# Patient Record
Sex: Female | Born: 2000 | Hispanic: No | Marital: Single | Smoking: Never smoker
Health system: Southern US, Community
[De-identification: ages and names within clinical notes are randomized; demographics above are authoritative.]

## PROBLEM LIST (undated history)

## (undated) DIAGNOSIS — R7303 Prediabetes: Secondary | ICD-10-CM

## (undated) DIAGNOSIS — T7840XA Allergy, unspecified, initial encounter: Secondary | ICD-10-CM

## (undated) DIAGNOSIS — Z9109 Other allergy status, other than to drugs and biological substances: Secondary | ICD-10-CM

## (undated) HISTORY — PX: NO PAST SURGERIES: SHX2092

## (undated) HISTORY — DX: Allergy, unspecified, initial encounter: T78.40XA

---
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
## 2021-01-29 NOTE — Progress Notes (Signed)
Adolescent Well Care Visit Chrissy Ealey is a 20 y.o. female who is here for well care.     PCP:  Tiarah Shisler, [place], MD   History was provided by the patient.  Confidentiality was discussed with the patient and, if applicable, with caregiver.  Patient's personal phone number: [phone]  Current Issues:  No patient concerns.  Will be graduating and starting college in the fall.  She has full rides to both [employer] and [employer].  She is interested in attending nursing school and is still deciding which school to attend.  Will be living at home next year.   Chronic Conditions:  Allergic rhinitis - well controlled   Tinea versicolor  - seen on April 2022 and treated with ketoconazole shampoo x 1 dose and ketoconazole cream daily for three weeks.  She treated for just under two weeks and then stopped after she saw significant improvement.  She recently started the rash to return.   Inflammatory acne - well controlled.  Using Cetaphil BID face wash + oil free moisturizer + Duac as previously advised.   Nutrition: Nutrition/Eating Behaviors: variety of fruits, veg, protein; eats 3 meals per day and occasional snack  Adequate calcium in diet?: yes  Supplements/ Vitamins: No   Exercise/ Media: Play any Sports?:  none Exercise:  goes to gym every day for 2 hours -- weight lifting, core strengthening, and cardio  Screen Time:  > 2 hours-counseling provided  Sleep:  Sleep: at least 7-8 hours, falls asleep easily Sleep apnea symptoms: no   Social Screening: Lives with: parents and two younger siblings  Parental relations:  good Activities, Work, and Regulatory affairs officer?:  Yes - working at [employer], 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  no  Education: School grade: 12th School performance: doing well; no concerns School behavior: doing well; no concerns  Menstruation:   Patient's last menstrual period was 01/26/2021 (within  days). Menstrual History: significant cramping for first 2 days; sometimes has to stay home from school.  Some improvement with heat pads + Advil.  No heavy bleeding.  Periods last about 5 days.  Occur about once per month, regular.    Dental Assessment: Patient has a dental home: yes  Confidential social history: Tobacco?  no Secondhand smoke exposure?  no Drugs/ETOH?  no  Sexually Active?  no   Pregnancy Prevention: abstinence  Safe at home, in school & in relationships? Yes Safe to self?  Yes  Screenings:  The patient completed the Rapid Assessment for Adolescent Preventive Services screening questionnaire and the following topics were identified as risk factors and discussed: healthy eating  In addition, the following topics were discussed as part of anticipatory guidance: pregnancy prevention, depression/anxiety.  PHQ-9 completed and results indicated no concerns for depression.   Physical Exam:  Vitals:   01/30/21 1338  BP: 102/60  Pulse: (!) 102  SpO2: 96%  Weight: 166 lb (75.3 kg)  Height: 5\' 6"  (1.676 m)   BP 102/60 (BP Location: Right Arm, Patient Position: Sitting, Cuff Size: Normal)   Pulse (!) 102   Ht 5\' 6"  (1.676 m)   Wt 166 lb (75.3 kg)   LMP 01/26/2021 (Within Days)   SpO2 96%   BMI 26.79 kg/m  Body mass index: body mass index is 26.79 kg/m. Blood pressure percentiles are not available for patients who are 18 years or older.   Hearing Screening   Method: Audiometry   125Hz  250Hz  500Hz  1000Hz  2000Hz  3000Hz   4000Hz  6000Hz  8000Hz   Right ear:   20 20 20  20     Left ear:   20 25 20  20       Visual Acuity Screening   Right eye Left eye Both eyes  Without correction:     With correction: 20/20 20/20 20/20   Comments: Patient wearing contacts   General: well developed, no acute distress, gait normal, interactive  HEENT: PERRL, normal oropharynx, TMs normal bilaterally Neck: supple, no lymphadenopathy CV: RRR no murmur noted PULM: normal aeration  throughout all lung fields, no crackles or wheezes Abdomen: soft, non-tender; no masses or HSM Extremities: warm and well perfused GU: Normal female external genitalia and GU SMR stage 4 Skin: mild hyperpigmented patches over chest; scattered comedones over forehead Neuro: alert and oriented, moves all extremities equally   Assessment and Plan:  Makenzie Weisner is a 20 y.o. female who is here for well care.   Encounter for routine adult physical exam with abnormal findings  Dysmenorrhea Moderate dysmenorrhea impacting school attendance.  No heavy bleeding.  Differential includes endometriosis.  - Recommend taking naproxen two days before and at least 2 days after period - take with food. Rx sent to pharmacy.  - Reviewed period app trackers to help predict cycles - Return precautions reviewed.  Consider combined OCP if no improvement.   Need for vaccination  Counseling provided for all of the following: -     Meningococcal conjugate vaccine 4-valent IM - Discussed Mening B.  Deferred by patient today since planning to live at home for college.   Tinea versicolor Very mild.  Recent improvement with topical antifungal but incompletely treated.  - Restart ketoconazole cream.  Continue for 3 wks.  Has cream remaining at home.  Kortne to call if she needs refill.   Comedonal acne Well controlled with current skin care regimen.   BMI 26.0-26.9,adult, overweight BMI with sudden upward velocity.  Praised current exercise regimen and reviewed healthy eating.  BP appropriate.   - Consider fasting lipid panel, Hgb A1c in future   Well teen: -Growth: BMI is not appropriate for age -Development: appropriate for age  -Social-Emotional: mood appropriate with good coping strategies -Discussed anticipatory guidance including pregnancy/STI prevention, alcohol/drug use, screen time limits -Hearing screening result:normal -Vision screening result: normal -STI screening completed and is normal -Blood  pressure: appropriate for age and height   Provided chart of adult providers that she can call to schedule future physical with if she is ready to transition.  Reviewed transition timeline.      Return for f/u PRN for dysmenorrhea; f/u in 1 yr with PCP for [facility] (or estab with adult provider). , MD [facility]

## 2021-01-30 ENCOUNTER — Other Ambulatory Visit (HOSPITAL_COMMUNITY)
Admission: RE | Admit: 2021-01-30 | Discharge: 2021-01-30 | Disposition: A | Discharge status: Home / Self Care | Payer: Medicaid Other | Source: Ambulatory Visit | Attending: Pediatrics | Admitting: Pediatrics

## 2021-01-30 ENCOUNTER — Encounter: Payer: Self-pay | Admitting: Pediatrics

## 2021-01-30 ENCOUNTER — Ambulatory Visit (INDEPENDENT_AMBULATORY_CARE_PROVIDER_SITE_OTHER): Payer: Medicaid Other | Admitting: Pediatrics

## 2021-01-30 VITALS — BP 102/60 | HR 102 | Ht 66.0 in | Wt 166.0 lb

## 2021-01-30 DIAGNOSIS — Z114 Encounter for screening for human immunodeficiency virus [HIV]: Secondary | ICD-10-CM | POA: Diagnosis not present

## 2021-01-30 DIAGNOSIS — N946 Dysmenorrhea, unspecified: Secondary | ICD-10-CM | POA: Diagnosis not present

## 2021-01-30 DIAGNOSIS — Z23 Encounter for immunization: Secondary | ICD-10-CM | POA: Diagnosis not present

## 2021-01-30 DIAGNOSIS — Z0001 Encounter for general adult medical examination with abnormal findings: Secondary | ICD-10-CM | POA: Diagnosis not present

## 2021-01-30 DIAGNOSIS — Z6826 Body mass index (BMI) 26.0-26.9, adult: Secondary | ICD-10-CM | POA: Diagnosis not present

## 2021-01-30 DIAGNOSIS — B360 Pityriasis versicolor: Secondary | ICD-10-CM | POA: Diagnosis not present

## 2021-01-30 DIAGNOSIS — L700 Acne vulgaris: Secondary | ICD-10-CM | POA: Insufficient documentation

## 2021-01-30 DIAGNOSIS — Z113 Encounter for screening for infections with a predominantly sexual mode of transmission: Secondary | ICD-10-CM | POA: Insufficient documentation

## 2021-01-30 DIAGNOSIS — Z68.41 Body mass index (BMI) pediatric, 5th percentile to less than 85th percentile for age: Secondary | ICD-10-CM | POA: Insufficient documentation

## 2021-01-30 LAB — POCT RAPID HIV: Rapid HIV, POC: NEGATIVE

## 2021-01-30 MED ORDER — NAPROXEN 375 MG PO TBEC: 375.0000 mg | DELAYED_RELEASE_TABLET | Freq: Two times a day (BID) | ORAL | 2 refills | Status: DC

## 2021-01-30 NOTE — Patient Instructions (Signed)
  As your medical provider, it is important to me that you continue to receive high-quality primary care services as you transition to adulthood.  After the age of 21, you can no longer be seen at the [facility] for your primary care health services.   Below is a list of adult medicine practices that are currently accepting new patients.  Please reach out to one of these practices to schedule a new patient appointment as soon as possible.  Please be aware that you will not be able to be seen at our office after your 22nd birthday.   Adult Primary Care Clinics Name Criteria Services   [facility]  Address: [address] [place], [place] [address]  Phone: [phone] Hours: Monday - Friday 9 AM -6 PM  Types of insurance accepted:  Commercial insurance [facility] (orange card) Medicaid Medicare Uninsured  Language services:  Video and phone interpreters available   Ages 18 and older    Adult primary care Onsite pharmacy Integrated behavioral health Financial assistance counseling Walk-in hours for established patients  Financial assistance counseling hours: Tuesdays 2:00PM - 5:00PM  Thursday 8:30AM - 4:30PM  Space is limited, 10 on Tuesday and 20 on Thursday on a first come, first serve basis  Name Criteria Services   [facility]  Address: [address] [place], [place] [address]  Phone: [phone]  Hours: Monday - Friday 8:30 AM - 5 PM  Types of insurance accepted:  Commercial insurance Medicaid Medicare Uninsured  Language services:  Video and phone interpreters available   All ages - newborn to adult   Primary care for all ages (children and adults) Integrated behavioral health Nutritionist Financial assistance counseling   Name Criteria Services   [facility]  Located on the ground floor of  [facility]  Address: [address]  [place],  [place]  [address]  Phone: [phone]  Hours: Monday - Friday 8:15 AM - 5 PM  Types of insurance accepted:  Commercial insurance Medicaid Medicare Uninsured  Language services:  Video and phone interpreters available   Ages 18 and older   Adult primary care Nutritionist Certified Diabetes Educator  Integrated behavioral health Financial assistance counseling   Name Criteria Services   [facility]  Address: [address] [place], [place] [address]  Phone: [phone]  Hours: Monday - Friday 8:30 AM - 5 PM    Types of insurance accepted:  Commercial insurance Medicaid Medicare Uninsured  Language services:  Video and phone interpreters available   All ages - newborn to adult   Primary care for all ages (children and adults) Integrated behavioral health Financial assistance counseling    

## 2021-01-31 LAB — URINE CYTOLOGY ANCILLARY ONLY
Chlamydia: NEGATIVE
Comment: NEGATIVE
Comment: NORMAL
Neisseria Gonorrhea: NEGATIVE

## 2021-04-17 DIAGNOSIS — H5213 Myopia, bilateral: Secondary | ICD-10-CM | POA: Diagnosis not present

## 2021-07-31 DIAGNOSIS — H5213 Myopia, bilateral: Secondary | ICD-10-CM | POA: Diagnosis not present

## 2021-08-28 ENCOUNTER — Ambulatory Visit: Payer: Medicaid Other | Admitting: Podiatry

## 2021-08-30 ENCOUNTER — Other Ambulatory Visit: Payer: Self-pay

## 2021-08-30 ENCOUNTER — Ambulatory Visit (INDEPENDENT_AMBULATORY_CARE_PROVIDER_SITE_OTHER): Payer: Medicaid Other | Admitting: Podiatry

## 2021-08-30 DIAGNOSIS — L600 Ingrowing nail: Secondary | ICD-10-CM

## 2021-08-30 DIAGNOSIS — Z9889 Other specified postprocedural states: Secondary | ICD-10-CM

## 2021-08-30 NOTE — Progress Notes (Signed)
°  Subjective:  Patient ID: Kristen Sexton, female    DOB: 13-Apr-2001,  MRN: [id number]  Chief Complaint  Patient presents with   Nail Problem    left foot pain-great toe pain thinks nail is growing back    20 y.o. female presents with the above complaint. History confirmed with patient.  She has previously had the ingrown toenails removed and felt like it was growing back and she dug a piece of hard skin or nail out  Objective:  Physical Exam: warm, good capillary refill, no trophic changes or ulcerative lesions, normal DP and PT pulses, normal sensory exam, and no recurrent ingrown bilateral, there is hyperkeratosis on the lateral nail fold and a dorsal ridge on the right hallux nail. Assessment:   1. Ingrown right big toenail   2. Ingrown left big toenail   3. Status post surgical removal of nail matrix of toe      Plan:  Patient was evaluated and treated and all questions answered.  Inspected the nails and she has no recurrence of ingrowing currently.  We debrided the ridge on the right hallux nail to make it flush with the rest of the nail that I think is likely secondary to trauma from the procedure.  She will continue to monitor and return as needed  No follow-ups on file.

## 2021-09-05 ENCOUNTER — Telehealth: Payer: Self-pay | Admitting: Podiatry

## 2021-09-05 MED ORDER — MUPIROCIN 2 % EX OINT: 1.0000 "application " | TOPICAL_OINTMENT | Freq: Two times a day (BID) | CUTANEOUS | 2 refills | Status: DC

## 2021-09-05 NOTE — Telephone Encounter (Signed)
Medication : bactroban   Pharmacy [facility] on [address]    Patient wants a refill on this medication - she thinks that this will help with the drainage

## 2021-09-06 ENCOUNTER — Other Ambulatory Visit: Payer: Self-pay

## 2021-09-06 ENCOUNTER — Ambulatory Visit (INDEPENDENT_AMBULATORY_CARE_PROVIDER_SITE_OTHER): Payer: Medicaid Other | Admitting: Podiatry

## 2021-09-06 DIAGNOSIS — L03031 Cellulitis of right toe: Secondary | ICD-10-CM | POA: Diagnosis not present

## 2021-09-06 DIAGNOSIS — L600 Ingrowing nail: Secondary | ICD-10-CM | POA: Diagnosis not present

## 2021-09-06 MED ORDER — DOXYCYCLINE HYCLATE 100 MG PO TABS: 100.0000 mg | ORAL_TABLET | Freq: Two times a day (BID) | ORAL | 0 refills | Status: AC

## 2021-09-06 NOTE — Progress Notes (Signed)
She presents today for follow-up of her left paronychia seen by Dr. Lilian Kapur last week.  She states that seems to be doing much better but she complains today of a painful area to the fibular border of the right hallux for matricectomy has previously been performed over a year ago.  She states that there was a small piece of nail that was present and she peeled it and it resulted in pain.  She says has been some pus recently with swelling and drainage.  Objective: Vital signs are stable she alert oriented x3 left toe looks great hallux right demonstrates some mild erythema and with pressure does demonstrate a small bulb of purulence.  No malodor.  I curetted the margin and it appears to be just a small focal pulm paronychia secondary to her pulling the nail out.  Assessment: Paronychia.  Plan: Start her on doxycycline she will start soaking in Epson salts and warm water twice daily and apply a small amount of Neosporin at nighttime.  I will follow-up with her in 2 weeks or so.

## 2022-01-17 ENCOUNTER — Telehealth: Payer: Self-pay

## 2022-01-17 DIAGNOSIS — Z111 Encounter for screening for respiratory tuberculosis: Secondary | ICD-10-CM

## 2022-01-17 NOTE — Telephone Encounter (Signed)
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 Discussed options and scheduled appointment tomorrow at [facility] lab for quantiferon gold. Routing to PCP for future order entry. ?

## 2022-01-17 NOTE — Telephone Encounter (Signed)
Quant gold placed per patient request.  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   ? ?Enis Gash, MD ?[facility]  ? ?

## 2022-01-18 ENCOUNTER — Other Ambulatory Visit: Payer: Medicaid Other

## 2022-01-18 ENCOUNTER — Other Ambulatory Visit: Payer: Self-pay

## 2022-01-18 DIAGNOSIS — Z111 Encounter for screening for respiratory tuberculosis: Secondary | ICD-10-CM | POA: Diagnosis not present

## 2022-01-21 LAB — QUANTIFERON-TB GOLD PLUS
Mitogen-NIL: 8.69 IU/mL
NIL: 0.02 IU/mL
QuantiFERON-TB Gold Plus: NEGATIVE
TB1-NIL: 0 IU/mL
TB2-NIL: 0.01 IU/mL

## 2022-04-22 DIAGNOSIS — M5386 Other specified dorsopathies, lumbar region: Secondary | ICD-10-CM | POA: Insufficient documentation

## 2022-05-04 ENCOUNTER — Other Ambulatory Visit: Payer: Self-pay | Admitting: Pediatrics

## 2022-05-04 DIAGNOSIS — B360 Pityriasis versicolor: Secondary | ICD-10-CM

## 2022-05-09 ENCOUNTER — Other Ambulatory Visit: Payer: Self-pay | Admitting: Pediatrics

## 2022-05-09 DIAGNOSIS — B360 Pityriasis versicolor: Secondary | ICD-10-CM

## 2022-05-17 ENCOUNTER — Ambulatory Visit (INDEPENDENT_AMBULATORY_CARE_PROVIDER_SITE_OTHER): Payer: Medicaid Other | Admitting: Pediatrics

## 2022-05-17 VITALS — Temp 98.0°F | Wt 182.6 lb

## 2022-05-17 DIAGNOSIS — F430 Acute stress reaction: Secondary | ICD-10-CM

## 2022-05-17 DIAGNOSIS — Z658 Other specified problems related to psychosocial circumstances: Secondary | ICD-10-CM | POA: Diagnosis not present

## 2022-05-17 DIAGNOSIS — R7303 Prediabetes: Secondary | ICD-10-CM | POA: Diagnosis not present

## 2022-05-17 DIAGNOSIS — L650 Telogen effluvium: Secondary | ICD-10-CM

## 2022-05-17 DIAGNOSIS — E639 Nutritional deficiency, unspecified: Secondary | ICD-10-CM

## 2022-05-17 DIAGNOSIS — N946 Dysmenorrhea, unspecified: Secondary | ICD-10-CM | POA: Diagnosis not present

## 2022-05-17 DIAGNOSIS — L659 Nonscarring hair loss, unspecified: Secondary | ICD-10-CM

## 2022-05-17 DIAGNOSIS — B360 Pityriasis versicolor: Secondary | ICD-10-CM | POA: Diagnosis not present

## 2022-05-17 DIAGNOSIS — L83 Acanthosis nigricans: Secondary | ICD-10-CM | POA: Diagnosis not present

## 2022-05-17 LAB — POCT GLYCOSYLATED HEMOGLOBIN (HGB A1C): Hemoglobin A1C: 6.1 % — AB (ref 4.0–5.6)

## 2022-05-17 LAB — POCT HEMOGLOBIN: Hemoglobin: 13.8 g/dL (ref 11–14.6)

## 2022-05-17 MED ORDER — CLOTRIMAZOLE 1 % EX CREA: 1.0000 | TOPICAL_CREAM | Freq: Two times a day (BID) | CUTANEOUS | 0 refills | Status: AC

## 2022-05-17 NOTE — Patient Instructions (Signed)
Thanks for letting me take care of you and your family.  It was a pleasure seeing you today.  Here's what we discussed:   Start clotrimazole cream TWO times per day.  Use up to THREE weeks.  Focus on high-iron foods.  See below for more information.   Give foods that are high in iron such as meats, fish, beans, eggs, dark leafy greens (kale, spinach), and fortified cereals (Cheerios, Oatmeal Squares, Mini Wheats).    Eating these foods along with a food containing vitamin C (such as oranges or strawberries) helps the body to absorb the iron.    Milk is very nutritious, but limit the amount of milk to no more than 16-20 oz per day.   Best Cereal Choices: Contain 90% of daily recommended iron.   All flavors of Oatmeal Squares and Mini Wheats are high in iron.       Next best cereal choices: Contain 45-50% of daily recommended iron.  Original and Multi-grain cheerios are high in iron - other flavors are not.   Original Rice Krispies and original Kix are also high in iron - other flavors are not.

## 2022-05-17 NOTE — Progress Notes (Unsigned)
PCP: Kristen Sexton, [place], MD   Chief Complaint  Patient presents with   Follow-up   Rash    Concerned about discoloration in chest.   Hair/Scalp Problem    Would like to have some lab work done to see why she's experiencing hair loss.    Medication Refill    She said she would like to start using Ketoconazole again.     Subjective:  HPI:  Kristen Sexton is a 21 y.o. female here to follow-up on hair loss and rash.   Rash over chest - starting to have hyperpigmentation over chest -- worries that tinea versicolor is returning.  Would like to restart cream.  No itching, erythema, or flaking.  No new jewelry.   Hyperpigmentation - starting to have "darkening" under arms and in groin.  Thought it was related to shaving -- currently trims hair.  Considering waxing but wondering if this is okay.    Hair loss - hair is starting to thin.  She sees a lot more hair in the shower drain (has photo).  No changes to diet or nutrition.  Eats meals TID -- not many iron-rich foods.  Does have a little bit of dizziness on standing.  Periods are typically 7-8 days, but not heavy.  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  Takes a final exam next week.  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  Last seen May 2022.  Discussed transition at that time.   Meds: Current Outpatient Medications  Medication Sig Dispense Refill   clotrimazole (LOTRIMIN) 1 % cream Apply 1 Application topically 2 (two) times daily for 28 days. Apply to rash for 2 to 4 weeks, until clear. 60 g 0   ketoconazole (NIZORAL) 2 % cream Apply 1 application topically daily. Apply to rash daily for 3 weeks. (Patient not taking: Reported on 05/17/2022) 15 g 0   mupirocin ointment (BACTROBAN) 2 % Apply 1 application topically 2 (two) times daily. (Patient not taking: Reported on 05/17/2022) 30 g 2   Naproxen 375 MG TBEC Take 1 tablet (375 mg total) by mouth in the morning and at  bedtime. Start two days before your period and continue through the first two days of your period. (Patient not taking: Reported on 05/17/2022) 60 tablet 2   No current facility-administered medications for this visit.    ALLERGIES: No Known Allergies  PMH:  Past Medical History:  Diagnosis Date   Allergy    Environmental allergies     PSH:  Past Surgical History:  Procedure Laterality Date   NO PAST SURGERIES      Social history:  Social History   Social History Narrative   10th grade at [employer]    Family history: Family History  Problem Relation Age of Onset   Allergic rhinitis Neg Hx    Angioedema Neg Hx    Asthma Neg Hx    Eczema Neg Hx    Immunodeficiency Neg Hx    Urticaria Neg Hx    Irritable bowel syndrome Neg Hx    Celiac disease Neg Hx    GER disease Neg Hx      Objective:   Physical Examination:  Temp: 98 F (36.7 C) (Oral) Pulse:   BP:   (Blood pressure %iles are not available for patients who are 18 years or older.)  Wt: 182 lb 9.6 oz (82.8 kg)  Ht:    BMI:  Body mass index is 29.47 kg/m. (88 %ile (Z= 1.19) based on CDC (Girls, 2-20 Years) BMI-for-age based on BMI available as of 01/30/2021 from contact on 01/30/2021.) GENERAL: Well appearing, no distress HEENT: NCAT, clear sclerae, MMM NECK: Supple, no cervical LAD LUNGS: EWOB, CTAB, no wheeze, no crackles CARDIO: RRR, normal S1S2 no murmur, well perfused EXTREMITIES: Warm and well perfused, no deformity NEURO: Awake, alert, interactive SKIN:  -Very fine, thin, hypopigmented and hyperpigmented, oval macules coalescing into patches over chest between b/l breasts - no associated scale  -Hyperpigmentation in b/l axillae and slightly over posterior neck.  No fluctuance or erythema over axialle. -Shorter pieces of broken off hair along forehead line.  No bogginess, flaking, scale, or erythema.  Hair hanging in loose ponytail.    Assessment/Plan:   Kristen Sexton is a 21 y.o. old female here with early onset  tinea versicolor, acanthosis nigricans, and likely telogen effluvium.   Hair loss Stress response Telogen effluvium Likely telogen effluvium.  Differential includes anemia, hyperthyroidism, traction alopecia. No evidence of tinea capitis. - Provided reassurance.   - Eval for iron deficiency  -     POCT hemoglobin - normal - Referral to Behav Health to review stress reduction strategies   Acanthosis nigricans Prediabetes Likely explains hyperpigmentation in bilateral axillae and groin.  -     POCT glycosylated hemoglobin (Hb A1C)  - 6.1.  Attempted to reach patient after visit.  No answer. Left VM.  Will try to reach again  - needs dedicated visit for healthy lifestyles if ready for change  Poor nutrition -     POCT hemoglobin  Tinea versicolor -     clotrimazole (LOTRIMIN) 1 % cream; Apply 1 Application topically 2 (two) times daily for 28 days. Apply to rash for 2 to 4 weeks, until clear.  Follow up: Return for f/u behav health first [facility] - stress ; f/u PCP in 3 mo for skin;  f/u May 2023 well care .   Enis Gash, MD  [facility]

## 2022-05-21 DIAGNOSIS — L659 Nonscarring hair loss, unspecified: Secondary | ICD-10-CM | POA: Insufficient documentation

## 2022-05-21 DIAGNOSIS — H5213 Myopia, bilateral: Secondary | ICD-10-CM | POA: Diagnosis not present

## 2022-05-21 DIAGNOSIS — R7303 Prediabetes: Secondary | ICD-10-CM | POA: Insufficient documentation

## 2022-05-21 DIAGNOSIS — L650 Telogen effluvium: Secondary | ICD-10-CM | POA: Insufficient documentation

## 2022-05-21 DIAGNOSIS — L83 Acanthosis nigricans: Secondary | ICD-10-CM | POA: Insufficient documentation

## 2022-05-24 DIAGNOSIS — J039 Acute tonsillitis, unspecified: Secondary | ICD-10-CM | POA: Diagnosis not present

## 2022-05-24 DIAGNOSIS — U071 COVID-19: Secondary | ICD-10-CM | POA: Diagnosis not present

## 2022-05-24 DIAGNOSIS — M791 Myalgia, unspecified site: Secondary | ICD-10-CM | POA: Diagnosis not present

## 2022-05-24 DIAGNOSIS — R6883 Chills (without fever): Secondary | ICD-10-CM | POA: Diagnosis not present

## 2022-05-24 DIAGNOSIS — R5381 Other malaise: Secondary | ICD-10-CM | POA: Diagnosis not present

## 2022-05-29 ENCOUNTER — Ambulatory Visit: Payer: Medicaid Other | Admitting: Licensed Clinical Social Worker

## 2022-05-29 NOTE — BH Specialist Note (Signed)
Contacted patient regarding virtual appointment. Patient reported being unable to attend appointment due to time. Patient reported she would call back to reschedule.

## 2022-06-27 DIAGNOSIS — R1032 Left lower quadrant pain: Secondary | ICD-10-CM | POA: Diagnosis not present

## 2022-06-27 DIAGNOSIS — R1013 Epigastric pain: Secondary | ICD-10-CM | POA: Diagnosis not present

## 2022-06-27 DIAGNOSIS — K2970 Gastritis, unspecified, without bleeding: Secondary | ICD-10-CM | POA: Diagnosis not present

## 2022-07-17 ENCOUNTER — Other Ambulatory Visit: Payer: Self-pay | Admitting: Pediatrics

## 2022-07-17 ENCOUNTER — Telehealth: Payer: Self-pay | Admitting: Pediatrics

## 2022-07-17 ENCOUNTER — Ambulatory Visit
Admission: RE | Admit: 2022-07-17 | Discharge: 2022-07-17 | Disposition: A | Discharge status: Home / Self Care | Payer: Medicaid Other | Source: Ambulatory Visit | Attending: Urgent Care | Admitting: Urgent Care

## 2022-07-17 ENCOUNTER — Ambulatory Visit (INDEPENDENT_AMBULATORY_CARE_PROVIDER_SITE_OTHER): Payer: Medicaid Other

## 2022-07-17 VITALS — BP 119/76 | HR 86 | Temp 97.7°F | Resp 16

## 2022-07-17 DIAGNOSIS — M545 Low back pain, unspecified: Secondary | ICD-10-CM | POA: Diagnosis not present

## 2022-07-17 DIAGNOSIS — M5442 Lumbago with sciatica, left side: Secondary | ICD-10-CM | POA: Diagnosis not present

## 2022-07-17 DIAGNOSIS — M79605 Pain in left leg: Secondary | ICD-10-CM | POA: Diagnosis not present

## 2022-07-17 HISTORY — DX: Prediabetes: R73.03

## 2022-07-17 MED ORDER — PREDNISONE 20 MG PO TABS: ORAL_TABLET | ORAL | 0 refills | Status: DC

## 2022-07-17 MED ORDER — TIZANIDINE HCL 4 MG PO TABS
4.0000 mg | ORAL_TABLET | Freq: Every day | ORAL | 0 refills | Status: DC
Start: 2022-07-17 — End: 2023-07-08

## 2022-07-17 NOTE — ED Triage Notes (Signed)
Low back pain radiating down left leg, pulling in nature. 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 Over the last two weeks the pain has become sharp in nature, believes she may have sciatica. Also describes some tingling in affected area. Denies any other traumatic injury to back or leg. States now she works at [employer], and occasionally gets a pain that feels like "an electric shock" in her back.

## 2022-07-17 NOTE — ED Provider Notes (Signed)
[facility]  Note:  This document was prepared using Systems analyst and may include unintentional dictation errors.  MRN: [id number] DOB: September 21, 2000  Subjective:   Kristen Sexton is a 21 y.o. female presenting for 2-week history of progressively worsening left-sided low back pain that radiates all the way into the left foot.  Symptoms are very bothersome and rated 5 out of 10, moderate, mostly constant now.  She contacted her primary care provider who advised that she come to get an x-ray with [place] before they saw her.  No recent trauma.  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  This happened months ago.  That particular episode resolved and had very similar symptoms to her current episode.  She never saw a back specialist, had imaging done. No fall, trauma, saddle paresthesia, changes to bowel or urinary habits.   No current facility-administered medications for this encounter.  Current Outpatient Medications:    Naproxen 375 MG TBEC, Take 1 tablet (375 mg total) by mouth in the morning and at bedtime. Start two days before your period and continue through the first two days of your period., Disp: 60 tablet, Rfl: 2   No Known Allergies  Past Medical History:  Diagnosis Date   Allergy    Environmental allergies    Pre-diabetes      Past Surgical History:  Procedure Laterality Date   NO PAST SURGERIES      Family History  Problem Relation Age of Onset   Allergic rhinitis Neg Hx    Angioedema Neg Hx    Asthma Neg Hx    Eczema Neg Hx    Immunodeficiency Neg Hx    Urticaria Neg Hx    Irritable bowel syndrome Neg Hx    Celiac disease Neg Hx    GER disease Neg Hx     Social History   Tobacco Use   Smoking status: Never   Smokeless tobacco: Never    ROS   Objective:   Vitals: BP 119/76 (BP Location: Right Arm)   Pulse 86   Temp 97.7 F (36.5 C) (Oral)    Resp 16   SpO2 98%   Physical Exam Constitutional:      General: She is not in acute distress.    Appearance: Normal appearance. She is well-developed. She is not ill-appearing, toxic-appearing or diaphoretic.  HENT:     Head: Normocephalic and atraumatic.     Nose: Nose normal.     Mouth/Throat:     Mouth: Mucous membranes are moist.  Eyes:     General: No scleral icterus.       Right eye: No discharge.        Left eye: No discharge.     Extraocular Movements: Extraocular movements intact.  Cardiovascular:     Rate and Rhythm: Normal rate.  Pulmonary:     Effort: Pulmonary effort is normal.  Musculoskeletal:     Lumbar back: No swelling, edema, deformity, signs of trauma, lacerations, spasms, tenderness or bony tenderness. Decreased range of motion. Positive left straight leg raise test. Negative right straight leg raise test. No scoliosis.  Skin:    General: Skin is warm and dry.  Neurological:     General: No focal deficit present.     Mental Status: She is alert and oriented to person, place, and time.     Motor: No weakness.  Coordination: Coordination normal.     Gait: Gait normal.     Deep Tendon Reflexes: Reflexes normal.  Psychiatric:        Mood and Affect: Mood normal.        Behavior: Behavior normal.        Thought Content: Thought content normal.        Judgment: Judgment normal.   DG Lumbar Spine Complete  Result Date: 07/17/2022 CLINICAL DATA:  Low back and left leg pain. EXAM: LUMBAR SPINE - COMPLETE 4+ VIEW COMPARISON:  None Available. FINDINGS: There is no evidence of lumbar spine fracture. Alignment is normal. Intervertebral disc spaces are maintained. IMPRESSION: Negative. Electronically Signed   By: Lupita Raider M.D.   On: 07/17/2022 10:37    Assessment and Plan :   PDMP not reviewed this encounter.  1. Acute left-sided low back pain with left-sided sciatica     I was agreeable with patient and pursued an x-ray. X-ray over-read was pending  at time of discharge, recommended follow up with only abnormal results. Otherwise will not call for negative over-read. Patient was in agreement.  Recommended aggressive management given the progressive nature of her symptoms and the fact that patient has not necessarily responded well to naproxen in the past.  Use tizanidine as needed.  Follow-up with [facility].   I advised patient that she does not have to see her primary care provider since we are taking care of her and having her follow-up with a spine specialty clinic. Counseled patient on potential for adverse effects with medications prescribed/recommended today, ER and return-to-clinic precautions discussed, patient verbalized understanding.    Wallis Bamberg, PA-C 07/17/22 1735

## 2022-07-17 NOTE — Telephone Encounter (Signed)
Kristen Sexton was seen at urgent care this morning for back pain with radiation.  She was advised to follow-up with a back specialist.  Appointment is scheduled with me for tomorrow morning for follow-up from urgent care which is not necessary - he should instead follow-up with the back specialist.  I called and left a VM for the patient to ask if she would like the referral sent to the back specialist and the appointment for tomorrow can be cancelled.

## 2022-07-18 ENCOUNTER — Ambulatory Visit: Payer: Medicaid Other | Admitting: Pediatrics

## 2022-07-20 DIAGNOSIS — M545 Low back pain, unspecified: Secondary | ICD-10-CM | POA: Diagnosis not present

## 2022-07-20 DIAGNOSIS — M5432 Sciatica, left side: Secondary | ICD-10-CM | POA: Diagnosis not present

## 2022-07-22 ENCOUNTER — Telehealth: Payer: Self-pay | Admitting: Pediatrics

## 2022-07-22 NOTE — Telephone Encounter (Signed)
Patient lvm requesting that referral for [facility] be placed as urgent due to them having a long wait list to be seen. The referral has been sent; however, if this is an urgent concern the provider would need to contact the office in order for the patient to be seen sooner. Dr. Doneen Poisson spoke with patient last week concerning this issue but referral was not marked as urgent.

## 2022-07-23 NOTE — Telephone Encounter (Signed)
Left VM on patient's cell requesting a call back to discuss the referral.

## 2022-07-25 NOTE — Telephone Encounter (Signed)
I called and spoke with Maralyn Sago.  She reports that she was able to schedule with [facility] for Monday 07/29/22.

## 2022-07-29 DIAGNOSIS — M5416 Radiculopathy, lumbar region: Secondary | ICD-10-CM | POA: Diagnosis not present

## 2022-08-06 ENCOUNTER — Emergency Department (HOSPITAL_COMMUNITY)
Admission: EM | Admit: 2022-08-06 | Discharge: 2022-08-07 | Disposition: A | Discharge status: Home / Self Care | Payer: Medicaid Other | Attending: Emergency Medicine | Admitting: Emergency Medicine

## 2022-08-06 ENCOUNTER — Encounter (HOSPITAL_COMMUNITY): Payer: Self-pay

## 2022-08-06 ENCOUNTER — Other Ambulatory Visit: Payer: Self-pay

## 2022-08-06 DIAGNOSIS — M5432 Sciatica, left side: Secondary | ICD-10-CM | POA: Diagnosis not present

## 2022-08-06 DIAGNOSIS — M5442 Lumbago with sciatica, left side: Secondary | ICD-10-CM | POA: Diagnosis not present

## 2022-08-06 DIAGNOSIS — M549 Dorsalgia, unspecified: Secondary | ICD-10-CM | POA: Diagnosis present

## 2022-08-06 NOTE — ED Triage Notes (Signed)
Pt to ED pov, ambulatory to triage. Pt seen for same recently.  Pt c/o intermittent back pain, left leg pain, left leg numbness, and bladder incontinence.  Pt states she woke up this morning and she had urinated on herself. Pt states that this is the only time this has happened and she has had bowel/bladder control since then.

## 2022-08-07 NOTE — ED Provider Notes (Signed)
[facility] EMERGENCY DEPARTMENT Provider Note   CSN: [id number] Arrival date & time: 08/06/22  2221     History  Chief Complaint  Patient presents with   Back Pain    Kristen Sexton is a 21 y.o. female.  HPI   Patient seen medical history presents with complaints of left-sided back pain, being on for last 3 weeks, states its sciatica, states she feels it rating from her left buttocks down to her left leg, she denies any recent trauma, she denies any saddle paresthesias, leg weakness, she does note that she does have some paresthesias moving down her left leg, she does note that she had some urinary incontinency 1 time yesterday morning but has not had since, states she urinate out any difficulty, she is unsure if this was just from the medications that she was taking.  She denies any fevers chills cough congestion no history of IV drug use.  Currently being followed by neurosurgery.    Home Medications Prior to Admission medications   Medication Sig Start Date End Date Taking? Authorizing Provider  Naproxen 375 MG TBEC Take 1 tablet (375 mg total) by mouth in the morning and at bedtime. Start two days before your period and continue through the first two days of your period. 01/30/21   Florestine Avers [place], MD  predniSONE (DELTASONE) 20 MG tablet Take 2 tablets daily with breakfast. 07/17/22   Wallis Bamberg, PA-C  tiZANidine (ZANAFLEX) 4 MG tablet Take 1 tablet (4 mg total) by mouth at bedtime. 07/17/22   Wallis Bamberg, PA-C      Allergies    Patient has no known allergies.    Review of Systems   Review of Systems  Constitutional:  Negative for chills and fever.  Respiratory:  Negative for shortness of breath.   Cardiovascular:  Negative for chest pain.  Gastrointestinal:  Negative for abdominal pain.  Musculoskeletal:  Positive for back pain.  Neurological:  Negative for headaches.    Physical Exam Updated Vital Signs BP (!) 149/99 (BP Location: Right Arm)   Pulse  97   Temp 99 F (37.2 C) (Oral)   Resp 16   Ht 5\' 6"  (1.676 m)   Wt 79.4 kg   LMP 07/23/2022 (Approximate)   SpO2 99%   BMI 28.25 kg/m  Physical Exam Vitals and nursing note reviewed.  Constitutional:      General: She is not in acute distress.    Appearance: She is not ill-appearing.  HENT:     Head: Normocephalic and atraumatic.     Nose: No congestion.  Eyes:     Conjunctiva/sclera: Conjunctivae normal.  Cardiovascular:     Rate and Rhythm: Normal rate and regular rhythm.     Pulses: Normal pulses.     Heart sounds: No murmur heard.    No friction rub. No gallop.  Pulmonary:     Effort: No respiratory distress.     Breath sounds: No wheezing, rhonchi or rales.  Abdominal:     Palpations: Abdomen is soft.     Tenderness: There is no abdominal tenderness. There is no right CVA tenderness or left CVA tenderness.  Musculoskeletal:     Comments: Spine was palpated was nontender to palpation no step-off deformities noted, there is no overlying skin changes, patient has point tenderness on her left buttocks, is focalized reproducible, she has 5 out of 5 strength in the lower extremities bilaterally, she has 2+ dorsal pedal pulses bilaterally, sensation intact to light touch, she  is ambulating at difficulty.  She has 2+ reflexes in the patella as well as Achilles.  Skin:    General: Skin is warm and dry.  Neurological:     Mental Status: She is alert.  Psychiatric:        Mood and Affect: Mood normal.     ED Results / Procedures / Treatments   Labs (all labs ordered are listed, but only abnormal results are displayed) Labs Reviewed - No data to display  EKG None  Radiology No results found.  Procedures Procedures    Medications Ordered in ED Medications - No data to display  ED Course/ Medical Decision Making/ A&P                           Medical Decision Making  This patient presents to the ED for concern of left leg pain, this involves an extensive  number of treatment options, and is a complaint that carries with it a high risk of complications and morbidity.  The differential diagnosis includes spine equina, spinal fracture, epidural abscess    Additional history obtained:  Additional history obtained from N/A External records from outside source obtained and reviewed including pediatrician notes   Co morbidities that complicate the patient evaluation  N/A  Social Determinants of Health:  N/A    Lab Tests:  I Ordered, and personally interpreted labs.  The pertinent results include: N/A   Imaging Studies ordered:  I ordered imaging studies including N/A I independently visualized and interpreted imaging which showed n/a I agree with the radiologist interpretation   Cardiac Monitoring:  The patient was maintained on a cardiac monitor.  I personally viewed and interpreted the cardiac monitored which showed an underlying rhythm of: N/A   Medicines ordered and prescription drug management:  I ordered medication including N/A I have reviewed the patients home medicines and have made adjustments as needed  Critical Interventions:  N/A   Reevaluation:  Presents with back pain, seems consistent with sciatica, she does endorse an episode of urinary incontinency, obtain post residual void, it was 0, she is agreement with plan discharge at this time.  Consultations Obtained:  N/A   Test Considered:  N/a    Rule out I have low suspicion for spinal fracture or spinal cord abnormality as patient no saddle paresthesias, presentation is atypical as she only endorses paresthesias down the left leg should be bilateral, she also has no urinary retention noted on ultrasound, patient had 5/5 strength, full range of motion, neurovascular fully intact in the lower extremities, as well as reflexes.  Suspicion for AAA or dissection is also low low risk factors, pain is focalized and easily reproducible.  Low suspicion for  septic arthritis as patient denies IV drug use, skin exam was performed no erythematous, edema or warm joints noted.     Dispostion and problem list  After consideration of the diagnostic results and the patients response to treatment, I feel that the patent would benefit from discharge.  Sciatica-we will have her continue with her medications which were prescribed to her including prednisone as well as a muscle relaxer, will have her continue with NSAIDs follow-up with her neurosurgeon for further evaluation strict return precautions            Final Clinical Impression(s) / ED Diagnoses Final diagnoses:  Sciatica of left side    Rx / DC Orders ED Discharge Orders     None  Carroll Sage, PA-C 08/07/22 [id number]    Dione Booze, MD 08/07/22 [phone]

## 2022-08-07 NOTE — Discharge Instructions (Signed)
You have been seen here for back pain, I recommend taking over-the-counter pain medications like ibuprofen and/or Tylenol every 6 as needed.  Please follow dosage and on the back of bottle.  I also recommend applying heat to the area and stretching out the muscles as this will help decrease stiffness and pain.  I have given you information on exercises please follow.  Please follow-up with the neurosurgeon as needed  Come back to the emergency department if you develop chest pain, shortness of breath, severe abdominal pain, uncontrolled nausea, vomiting, diarrhea.

## 2022-08-11 DIAGNOSIS — R140 Abdominal distension (gaseous): Secondary | ICD-10-CM | POA: Diagnosis not present

## 2022-08-11 DIAGNOSIS — Z3202 Encounter for pregnancy test, result negative: Secondary | ICD-10-CM | POA: Diagnosis not present

## 2022-08-11 DIAGNOSIS — R350 Frequency of micturition: Secondary | ICD-10-CM | POA: Diagnosis not present

## 2022-08-11 DIAGNOSIS — M5432 Sciatica, left side: Secondary | ICD-10-CM | POA: Diagnosis not present

## 2022-08-11 DIAGNOSIS — M545 Low back pain, unspecified: Secondary | ICD-10-CM | POA: Diagnosis not present

## 2022-08-13 DIAGNOSIS — R202 Paresthesia of skin: Secondary | ICD-10-CM | POA: Diagnosis not present

## 2022-08-13 DIAGNOSIS — M545 Low back pain, unspecified: Secondary | ICD-10-CM | POA: Diagnosis not present

## 2022-08-13 DIAGNOSIS — R200 Anesthesia of skin: Secondary | ICD-10-CM | POA: Diagnosis not present

## 2022-08-13 DIAGNOSIS — M5416 Radiculopathy, lumbar region: Secondary | ICD-10-CM | POA: Diagnosis not present

## 2022-08-20 ENCOUNTER — Ambulatory Visit: Payer: Medicaid Other | Admitting: Pediatrics

## 2022-08-20 ENCOUNTER — Ambulatory Visit: Payer: Medicaid Other | Admitting: Podiatry

## 2022-08-26 DIAGNOSIS — M5126 Other intervertebral disc displacement, lumbar region: Secondary | ICD-10-CM | POA: Diagnosis not present

## 2022-08-26 DIAGNOSIS — Z6829 Body mass index (BMI) 29.0-29.9, adult: Secondary | ICD-10-CM | POA: Diagnosis not present

## 2022-08-26 NOTE — Progress Notes (Deleted)
Adolescent Well Care Visit Kristen Sexton is a 21 y.o. female who is here for well care.     PCP:  Kalika Smay, [place], MD   History was provided by the {CHL AMB PERSONS; PED RELATIVES/OTHER W/[phone]}.  Confidentiality was discussed with the patient and, if applicable, with caregiver.  Patient's personal phone number: ***  Current Issues:  1.  2.  Chronic Conditions:***  Allergic rhinitis***  Tinea versicolor*** treated with cotrimazole BID for 4 weeks***  Comedonal acne***   Dysmenorrhea***  Prediabetes*** POCT HgbA1c today*** last was 6.1***  TMJ***   Hair loss - normal Hgb on 9/1.  Referred to [facility] for stress reduction strategies. ***  Sciatica*** no saddle paresthesias, left leg bilateral, no urinary retnetnion,  --- plan was to continue prednisone, muscle relaxer, and NSAIDS with f/u with her neurosurgeon Who is her neurosurgeon***[facility] for Monday 07/29/22.  Two way consent***  Transition today*** she will be 21 on 12[birth year]***  Vaccines: Mening B*** flu***   Nutrition: Nutrition/Eating Behaviors: *** Adequate calcium in diet?: *** Supplements/ Vitamins: ***  Exercise/ Media: Play any Sports?:  {Misc; sports:10024} Exercise:  {Exercise:23478} Screen Time:  {CHL AMB SCREEN TIME:[phone]}  Sleep:  Sleep: *** hours, {Sleep Patterns (Pediatrics):23200} Sleep apnea symptoms: {yes***/no:17258}   Social Screening: Lives with: {Persons; ped relatives w/o patient:19502} Parental relations:  {CHL AMB PED FAM [phone]} Activities, Work, and Regulatory affairs officer?: *** Concerns regarding behavior with peers?  {yes***/no:17258}  Education: School name: *** School grade: *** School performance: {performance:16655} School behavior: {misc; parental coping:16655}  Menstruation:   Patient's last menstrual period was 07/23/2022 (approximate). Menstrual History: ***   Dental Assessment: Patient has a dental home: yes  Confidential  social history: Tobacco?  {YES/NO/WILD CARDS:18581} Secondhand smoke exposure?  {YES/NO/WILD QMGQQ:76195} Drugs/ETOH?  {YES/NO/WILD KDTOI:71245}  Sexually Active?  {YES [id number]   Pregnancy Prevention: ***  Safe at home, in school & in relationships? Yes*** Safe to self?  Yes***  Screenings:  The patient completed the Rapid Assessment for Adolescent Preventive Services screening questionnaire and the following topics were identified as risk factors and discussed: {CHL AMB ASSESSMENT TOPICS:21012045}  In addition, the following topics were discussed as part of anticipatory guidance: pregnancy prevention, depression/anxiety.  PHQ-9 completed and results indicated ***  Physical Exam:  There were no vitals filed for this visit. LMP 07/23/2022 (Approximate)  Body mass index: body mass index is unknown because there is no height or weight on file. Blood pressure %iles are not available for patients who are 18 years or older.  No results found.  General: well developed, no acute distress, gait normal HEENT: PERRL, normal oropharynx, TMs normal bilaterally Neck: supple, no lymphadenopathy CV: RRR no murmur noted PULM: normal aeration throughout all lung fields, no crackles or wheezes Abdomen: soft, non-tender; no masses or HSM Extremities: warm and well perfused GU: {Pediatric Exam GU:23218}. Exam completed with chaperone present.  Skin: {pe acne:310162}, no other rashes Neuro: alert and oriented, moves all extremities equally   Assessment and Plan:  Kristen Sexton is a 21 y.o. female who is here for well care.   There are no diagnoses linked to this encounter.  Well teen: -Growth: BMI {ACTION; IS/IS YKD:98338250} appropriate for age -Development: {desc; development appropriate/delayed:19200}  -Social-Emotional: {Ped social-emotional health (teen):23219} -Discussed anticipatory guidance including pregnancy/STI prevention, alcohol/drug use, screen time limits -Hearing screening  result:{normal/abnormal/not examined:14677} -Vision screening result: {normal/abnormal/not examined:14677} -STI screening completed*** -Blood pressure: {Pediatric blood pressure:23220}  Need for vaccination:  -Counseling provided for all vaccine components No orders of  the defined types were placed in this encounter.    No follow-ups on file.Enis Gash, MD [facility]

## 2022-08-26 NOTE — Patient Instructions (Incomplete)
   As your medical provider, it is important to me that you continue to receive high-quality primary care services as you transition to adulthood.  After the age of 64, you can no longer be seen at the [facility] for your primary care health services.   Below is a list of adult medicine practices that are currently accepting new patients.  Please reach out to one of these practices to schedule a new patient appointment as soon as possible.  Please be aware that you will not be able to be seen at our office after your 22nd birthday.   Adult Primary Care Clinics Name Criteria Services   [facility]  Address: [address] [place], [place] [address]  Phone: [phone] Hours: Monday - Friday 9 AM -6 PM  Types of insurance accepted:  Commercial insurance [facility] (orange card) [employer] Uninsured  Language services:  Video and phone interpreters available   Ages 40 and older    Adult primary care Onsite pharmacy Integrated behavioral health Financial assistance counseling Walk-in hours for established patients  Financial assistance counseling hours: Tuesdays 2:00PM - 5:00PM  Thursday 8:30AM - 4:30PM  Space is limited, 10 on Tuesday and 20 on Thursday on a first come, first serve basis  Name Criteria Services   [facility]  Address: [address] [place], [place] [address]  Phone: [phone]  Hours: Monday - Friday 8:30 AM - 5 PM  Types of insurance accepted:  Nurse, learning disability Medicaid Medicare Uninsured  Language services:  Video and phone interpreters available   All ages - newborn to adult   Primary care for all ages (children and adults) Integrated behavioral health Nutritionist Financial assistance counseling   Name Criteria Services   [facility]  Located on the ground floor of  [facility]  Address: [address]  [place],  [place]  [address]  Phone: [phone]  Hours: Monday - Friday 8:15 AM - 5 PM  Types of insurance accepted:  Commercial insurance Medicaid Medicare Uninsured  Language services:  Video and phone interpreters available   Ages 58 and older   Adult primary care Nutritionist Certified Diabetes Educator  Integrated behavioral health Financial assistance counseling   Name Criteria Services   [facility]  Address: [address] [place], [place] [address]  Phone: [phone]  Hours: Monday - Friday 8:30 AM - 5 PM    Types of insurance accepted:  Nurse, learning disability Medicaid Medicare Uninsured  Language services:  Video and phone interpreters available   All ages - newborn to adult   Primary care for all ages (children and adults) Integrated behavioral health Financial assistance counseling      Below is the Lexicographer for [facility] at [facility].  You can also schedule an appointment online.  Please let me know if you have questions or any issues scheduling.    [facility]   Phone: [phone]  Website: [email or web address]  Address:  [facility] at [facility] [address] [place],  [place]  [address]  Hours: Sunday: Closed Monday: 8:00 AM - 5:30 PM Tuesday: 8:00 AM - 5:30 PM Wednesday: 8:00 AM - 5:30 PM Thursday: 8:00 AM - 5:30 PM Friday: 8:00 AM - 5:30 PM Saturday: Closed

## 2022-08-27 ENCOUNTER — Ambulatory Visit: Payer: Medicaid Other | Admitting: Pediatrics

## 2022-08-29 ENCOUNTER — Ambulatory Visit (INDEPENDENT_AMBULATORY_CARE_PROVIDER_SITE_OTHER): Payer: Medicaid Other | Admitting: Podiatry

## 2022-08-29 DIAGNOSIS — L03031 Cellulitis of right toe: Secondary | ICD-10-CM | POA: Diagnosis not present

## 2022-08-29 DIAGNOSIS — L603 Nail dystrophy: Secondary | ICD-10-CM

## 2022-08-29 MED ORDER — CLINDAMYCIN HCL 150 MG PO CAPS
150.0000 mg | ORAL_CAPSULE | Freq: Three times a day (TID) | ORAL | 1 refills | Status: DC
Start: 2022-08-29 — End: 2022-10-03

## 2022-09-01 NOTE — Progress Notes (Signed)
She presents today concerned that her nail is tender proximally.  She states that is been Sodiofolin tender back and here she points to the proximal nail fold of the hallux right.  Objective: Vital signs are stable alert and oriented x 3 there is mild erythema some fluctuance along the hallux nail fold it is missing the eponychium.  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  Plan: Start her on clindamycin Neosporin and soaks follow-up with her in 4 to 6 weeks.

## 2022-09-04 DIAGNOSIS — T887XXA Unspecified adverse effect of drug or medicament, initial encounter: Secondary | ICD-10-CM | POA: Diagnosis not present

## 2022-09-04 DIAGNOSIS — M5489 Other dorsalgia: Secondary | ICD-10-CM | POA: Diagnosis not present

## 2022-09-04 DIAGNOSIS — R0789 Other chest pain: Secondary | ICD-10-CM | POA: Diagnosis not present

## 2022-09-07 ENCOUNTER — Ambulatory Visit (INDEPENDENT_AMBULATORY_CARE_PROVIDER_SITE_OTHER): Payer: Medicaid Other

## 2022-09-07 ENCOUNTER — Ambulatory Visit
Admission: EM | Admit: 2022-09-07 | Discharge: 2022-09-07 | Disposition: A | Discharge status: Home / Self Care | Payer: Medicaid Other

## 2022-09-07 DIAGNOSIS — M5126 Other intervertebral disc displacement, lumbar region: Secondary | ICD-10-CM

## 2022-09-07 DIAGNOSIS — M533 Sacrococcygeal disorders, not elsewhere classified: Secondary | ICD-10-CM

## 2022-09-07 MED ORDER — NAPROXEN 500 MG PO TABS: 500.0000 mg | ORAL_TABLET | Freq: Two times a day (BID) | ORAL | 0 refills | Status: DC

## 2022-09-07 NOTE — ED Triage Notes (Signed)
Pt c/o "tail bone pain" started x 5 days-denies injury prior to pain-reports recent dx of sciatica and L5 ruptured disc-NAD-steady gait

## 2022-09-07 NOTE — ED Provider Notes (Signed)
[facility]RG[facility]  Note:  This document was prepared using Conservation officer, historic buildings and may include unintentional dictation errors.  MRN: [id number] DOB: 2000-10-21  Subjective:   Kristen Sexton is a 21 y.o. female presenting for 5-day history of acute onset persistent pain over the sacral area, tailbone and just off to the left side.  No fall, trauma, bruising, swelling.  No history of pilonidal abscess.  No redness.  No rash.  Patient recently was found to have a ruptured disc of the lumbar region at L5.  She is currently taking gabapentin, Flexeril, clindamycin and tizanidine.  No current facility-administered medications for this encounter.  Current Outpatient Medications:    cyclobenzaprine (FLEXERIL) 10 MG tablet, Take 10 mg by mouth 2 (two) times daily as needed., Disp: , Rfl:    gabapentin (NEURONTIN) 300 MG capsule, Take 300 mg by mouth 3 (three) times daily., Disp: , Rfl:    clindamycin (CLEOCIN) 150 MG capsule, Take 1 capsule (150 mg total) by mouth 3 (three) times daily., Disp: 30 capsule, Rfl: 1   Naproxen 375 MG TBEC, Take 1 tablet (375 mg total) by mouth in the morning and at bedtime. Start two days before your period and continue through the first two days of your period., Disp: 60 tablet, Rfl: 2   predniSONE (DELTASONE) 20 MG tablet, Take 2 tablets daily with breakfast., Disp: 10 tablet, Rfl: 0   tiZANidine (ZANAFLEX) 4 MG tablet, Take 1 tablet (4 mg total) by mouth at bedtime., Disp: 30 tablet, Rfl: 0   No Known Allergies  Past Medical History:  Diagnosis Date   Allergy    Environmental allergies    Pre-diabetes      Past Surgical History:  Procedure Laterality Date   NO PAST SURGERIES      Family History  Problem Relation Age of Onset   Allergic rhinitis Neg Hx    Angioedema Neg Hx    Asthma Neg Hx    Eczema Neg Hx    Immunodeficiency Neg Hx    Urticaria Neg Hx    Irritable bowel syndrome Neg Hx    Celiac disease Neg Hx    GER  disease Neg Hx     Social History   Tobacco Use   Smoking status: Never   Smokeless tobacco: Never  Vaping Use   Vaping Use: Never used  Substance Use Topics   Alcohol use: Never   Drug use: Never    ROS   Objective:   Vitals: BP 116/69 (BP Location: Left Arm)   Pulse (!) 108   Temp 99.5 F (37.5 C) (Oral)   Resp 16   LMP 08/21/2022   SpO2 96%   Physical Exam Constitutional:      General: She is not in acute distress.    Appearance: Normal appearance. She is well-developed. She is not ill-appearing, toxic-appearing or diaphoretic.  HENT:     Head: Normocephalic and atraumatic.     Nose: Nose normal.     Mouth/Throat:     Mouth: Mucous membranes are moist.  Eyes:     General: No scleral icterus.       Right eye: No discharge.        Left eye: No discharge.     Extraocular Movements: Extraocular movements intact.  Cardiovascular:     Rate and Rhythm: Normal rate.  Pulmonary:     Effort: Pulmonary effort is normal.  Musculoskeletal:       Back:  Comments: Near full range of motion.  Skin:    General: Skin is warm and dry.  Neurological:     General: No focal deficit present.     Mental Status: She is alert and oriented to person, place, and time.  Psychiatric:        Mood and Affect: Mood normal.        Behavior: Behavior normal.        Thought Content: Thought content normal.        Judgment: Judgment normal.    DG Sacrum/Coccyx  Result Date: 09/07/2022 CLINICAL DATA:  Sacral pain EXAM: SACRUM AND COCCYX - 3 VIEW COMPARISON:  None Available. FINDINGS: There is no evidence of fracture or other focal bone lesions. Increased stool overlying the rectosigmoid consistent with constipation was noted incidentally. IMPRESSION: No osseous abnormalities identified. Constipation incidentally noted. Electronically Signed   By: Layla Maw M.D.   On: 09/07/2022 10:56    Assessment and Plan :   PDMP not reviewed this encounter.  1. Sacral pain   2.  Ruptured lumbar disc    Anti-inflammatory pain medication and naproxen together with all of her other medications she is currently taking.  Follow-up with the spine clinic.  Counseled patient on potential for adverse effects with medications prescribed/recommended today, ER and return-to-clinic precautions discussed, patient verbalized understanding.    Wallis Bamberg, PA-C 09/07/22 1059

## 2022-09-11 DIAGNOSIS — R509 Fever, unspecified: Secondary | ICD-10-CM | POA: Diagnosis not present

## 2022-09-11 DIAGNOSIS — L0501 Pilonidal cyst with abscess: Secondary | ICD-10-CM | POA: Diagnosis not present

## 2022-09-17 ENCOUNTER — Other Ambulatory Visit: Payer: Self-pay

## 2022-09-17 ENCOUNTER — Ambulatory Visit: Payer: Medicaid Other | Attending: Pediatrics | Admitting: Physical Therapy

## 2022-09-17 DIAGNOSIS — M6281 Muscle weakness (generalized): Secondary | ICD-10-CM | POA: Insufficient documentation

## 2022-09-17 DIAGNOSIS — M5459 Other low back pain: Secondary | ICD-10-CM | POA: Diagnosis present

## 2022-09-17 NOTE — Therapy (Signed)
OUTPATIENT PHYSICAL THERAPY THORACOLUMBAR EVALUATION  Patient Name: Kristen Sexton MRN: [id number] DOB:May 17, 2001, 22 y.o., female Today's Date: 09/17/2022   PT End of Session - 09/17/22 1705     Visit Number 1    Number of Visits --   1-2x/week   Date for PT Re-Evaluation 11/12/22    Authorization Type [employer] MCD - ODI    PT Start Time 0415    PT Stop Time 0446    PT Time Calculation (min) 31 min             Past Medical History:  Diagnosis Date   Allergy    Environmental allergies    Pre-diabetes    Past Surgical History:  Procedure Laterality Date   NO PAST SURGERIES     Patient Active Problem List   Diagnosis Date Noted   Prediabetes 05/21/2022   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   Acanthosis nigricans 05/21/2022   Hair loss 05/21/2022   BMI (body mass index), pediatric, 5% to less than 85% for age 35/17/2022   BMI 26.0-26.9,adult 01/30/2021   Dysmenorrhea 01/30/2021   Comedonal acne 01/30/2021   TMJ (temporomandibular joint disorder) 04/07/2019   Tinea versicolor 04/10/2016   Viral warts 04/04/2016   Allergic rhinitis 07/20/2015    PCP: Hanvey, [place], MD  REFERRING PROVIDER: Hanvey, [place], MD  THERAPY DIAG:  Other low back pain - Plan: PT plan of care cert/re-cert  Muscle weakness - Plan: PT plan of care cert/re-cert  REFERRING DIAG: Other intervertebral disc displacement, 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 (Pt reports that this was confirmed with MRI)      PRECAUTIONS: None  WEIGHT BEARING RESTRICTIONS No  FALLS:  Has patient fallen in last 6 months? No, Number of falls: 0  MOI/History of condition:  Onset date: 04/2022  SUBJECTIVE STATEMENT  Kristen Sexton is a 22 y.o. 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  She didn't feel anything immediately following the  incident but had delayed pain after about 24 hours.  She she went about 2 months with minimal pain, but since about September the pain has increased.  She has a particularly hard time with standing for long periods.  She had an MRI which showed a "disk rupture" L sided L5.  She is trying to avoid surgery.  She does work with a job that requires her to stand for long periods.   She endorses n/t of L LE.  She feels like she has some weakness in her L LE.  From referring provider:   "Kristen Sexton is a 22 y.o. female presenting for 5-day history of acute onset persistent pain over the sacral area, tailbone and just off to the left side.  No fall, trauma, bruising, swelling.  No history of pilonidal abscess.  No redness.  No rash.  Patient recently was found to have a ruptured disc of the lumbar region at L5.  She is currently taking gabapentin, Flexeril, clindamycin and tizanidine."   Red flags:   Does endorse 1 instance of urinary incontinence, but states she went to ER and it was determined this was d/t medications she was taking.  Denies saddle anesthesia.  Pain:  Are you having pain? Yes Pain location: L sided low back and L LE pain NPRS scale:  6/10 to 8/10 Aggravating factors: standing (10 min) Relieving factors: sitting Pain description:  constant, burning, and aching Stage: Chronic Stability: getting worse 24 hour pattern: better in morning   Occupation: self checkout attendant at [employer]: NA  Hand Dominance: NA  Patient Goals/Specific Activities: reduce pain, avoid surgery   OBJECTIVE:   DIAGNOSTIC FINDINGS:  X-rays (-), pt states MRI shows ruptured disk L L5  SENSATION:  Light touch: Deficits diminished throughout L LE  MUSCLE LENGTH: Hamstrings: Right no restriction; Left no restriction ASLR: Right ASLR = PSLR; Left ASLR = PSLR Thomas test: Right no restriction; Left no restriction  LUMBAR AROM  AROM AROM  09/17/2022  Flexion Fingertips to  toes (WNL), w/ concordant pain  Extension WNL, w/ no pain  Right lateral flexion WNL  Left lateral flexion WNL  Right rotation WNL  Left rotation WNL, w/ concordant pain    (Blank rows = not tested)    LUMBAR SPECIAL TESTS:  Straight leg raise: L (+), R (-) Slump: L (+), R (-)  LE MMT:  MMT Right 09/17/2022 Left 09/17/2022  Hip flexion (L2, L3) 4 4*  Knee extension (L3) 4+ 3+*  Knee flexion 4 4  Hip abduction 3 non concordant pain 3 non concordant pain  Hip extension    Hip external rotation    Hip internal rotation    Hip adduction    Ankle dorsiflexion (L4) c c*  Ankle plantarflexion (S1) c c  Ankle inversion    Ankle eversion    Great Toe ext (L5) c c  Grossly     (Blank rows = not tested, score listed is out of 5 possible points.  N = WNL, D = diminished, C = clear for gross weakness with myotome testing, * = concordant pain with testing)   LE ROM:  ROM Right 09/17/2022 Left 09/17/2022  Hip flexion    Hip extension    Hip abduction    Hip adduction    Hip internal rotation    Hip external rotation    Knee flexion    Knee extension    Ankle dorsiflexion    Ankle plantarflexion    Ankle inversion    Ankle eversion      (Blank rows = not tested, N = WNL, * = concordant pain with testing)  Functional Tests  Eval (09/17/2022)                                                               PATIENT SURVEYS:  Modified Oswestry 28/50    TODAY'S TREATMENT  Creating, reviewing, and completing below HEP  PATIENT EDUCATION:  POC, diagnosis, prognosis, HEP, and outcome measures.  Pt educated via explanation, demonstration, and handout (HEP).  Pt confirms understanding verbally.   HOME EXERCISE PROGRAM: Access Code: [email or web address] URL: [email or web address] Date: 09/17/2022 Prepared by: Shearon Balo  Exercises - Supine Posterior Pelvic Tilt  - 2 x daily - 7 x weekly - 2 sets - 10 reps - 5'' hold - Seated Sciatic Tensioner  - 2 x daily  - 7 x weekly - 15 reps - Prone Press Up On Elbows  - 2 x daily - 7 x weekly - 2 sets - 10 reps - Standing Lumbar Extension  - 2 x daily - 7 x weekly - 3 sets - 10 reps  ASTERISK SIGNS  Asterisk Signs Eval (09/17/2022)       Standing tolerance 10 min       pain 6-8/10       Forward flexion Causes pain                         ASSESSMENT:  CLINICAL IMPRESSION: Raelie is a 22 y.o. female who presents to clinic with signs and sxs consistent with low back and L LE pain consistent with MRI showing disk pathology following lifting injury.  No gross weakness on exam, but pt is generally limited by pain with MMT of L LE.  Clear neural tension. Unclear how effective ext based program will be, but ext did not agg on exam.  Pt instructed to try ext when she gets pain in the L LE and see if there is a centralizing effect.  OBJECTIVE IMPAIRMENTS: Pain, core and hip strength  ACTIVITY LIMITATIONS: standing, work, lifting, bending  PERSONAL FACTORS: See medical history and pertinent history   REHAB POTENTIAL: Good  CLINICAL DECISION MAKING: Stable/uncomplicated  EVALUATION COMPLEXITY: Low   GOALS:   SHORT TERM GOALS: Target date: 10/15/2022  Rayah will be >75% HEP compliant to improve carryover between sessions and facilitate independent management of condition  Evaluation (09/17/2022): ongoing Goal status: INITIAL   LONG TERM GOALS: Target date: 11/12/2022  [place] will shows a >/= 12 pt improvement in their ODI score (MCID is 12 pts) as a proxy for functional improvement   Evaluation/Baseline (09/17/2022): 56 pts (28/50) Goal status: INITIAL   2.  Shailene will self report >/= 50% decrease in pain from evaluation   Evaluation/Baseline (09/17/2022): 8/10 max pain Goal status: INITIAL   3.  Blessen will be able to lift 25 lbs from the floor and place on a 3 foot counter, not limited by pain   Evaluation/Baseline (09/17/2022): unable Goal status: INITIAL   4.  Luceal will be able to stand  for 60 min for work, not limited by pain   Evaluation/Baseline (09/17/2022): 10 min Goal status: INITIAL    PLAN: PT FREQUENCY: 1-2x/week  PT DURATION: 8 weeks (Ending 11/12/2022)  PLANNED INTERVENTIONS: Therapeutic exercises, Aquatic therapy, Therapeutic activity, Neuro Muscular re-education, Gait training, Patient/Family education, Joint mobilization, Dry Needling, Electrical stimulation, Spinal mobilization and/or manipulation, Moist heat, Taping, Vasopneumatic device, Ionotophoresis 4mg /ml Dexamethasone, and Manual therapy  PLAN FOR NEXT SESSION: progressive hip and core, nerve glides, ext based?   PT, DPT 09/17/2022, 5:50 PM

## 2022-09-24 ENCOUNTER — Ambulatory Visit: Payer: Medicaid Other | Admitting: Physical Therapy

## 2022-09-26 ENCOUNTER — Ambulatory Visit: Payer: Medicaid Other | Admitting: Podiatry

## 2022-09-26 ENCOUNTER — Ambulatory Visit: Payer: Medicaid Other

## 2022-09-26 ENCOUNTER — Telehealth: Payer: Self-pay

## 2022-09-26 NOTE — Therapy (Incomplete)
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  Rationale for Evaluation and Treatment Rehabilitation  PERTINENT HISTORY: Disk rupture L5-S1? (Pt reports that this was confirmed with MRI)   SUBJECTIVE:                                                                                                                                                                                      SUBJECTIVE STATEMENT:  ***   Pain:  Are you having pain? Yes Pain location: L sided low back and L LE pain NPRS scale:  6/10 to 8/10 Aggravating factors: standing (10 min) Relieving factors: sitting Pain description: constant, burning, and aching Stage: Chronic Stability: getting worse 24 hour pattern: better in morning    OBJECTIVE: (objective measures completed at initial evaluation unless otherwise dated)   DIAGNOSTIC FINDINGS:  X-rays (-), pt states MRI shows ruptured disk L L5   SENSATION:          Light touch: Deficits diminished throughout L LE    MUSCLE LENGTH: Hamstrings: Right no restriction; Left no restriction ASLR: Right ASLR = PSLR; Left ASLR = PSLR Thomas test: Right no restriction; Left no restriction   LUMBAR AROM   AROM AROM  09/17/2022  Flexion Fingertips to toes (WNL), w/ concordant pain  Extension WNL, w/ no pain  Right lateral flexion WNL  Left lateral flexion WNL  Right rotation WNL  Left rotation WNL, w/ concordant pain    (Blank rows = not tested)     LUMBAR SPECIAL TESTS:  Straight leg raise: L (+), R (-) Slump: L (+), R (-)   LE MMT:   MMT Right 09/17/2022 Left 09/17/2022  Hip flexion (L2,  L3) 4 4*  Knee extension (L3) 4+ 3+*  Knee flexion 4 4  Hip abduction 3 non concordant pain 3 non concordant pain  Hip extension      Hip external rotation      Hip internal rotation      Hip adduction      Ankle dorsiflexion (L4) c c*  Ankle plantarflexion (S1) c c  Ankle inversion      Ankle eversion      Great Toe ext (L5) c c  Grossly        (Blank rows = not tested, score listed is out of 5 possible points.  N = WNL, D = diminished, C = clear for gross weakness with myotome testing, * = concordant pain with testing)     LE ROM:   ROM Right 09/17/2022 Left 09/17/2022  Hip flexion      Hip extension      Hip abduction      Hip adduction      Hip internal rotation      Hip external rotation      Knee flexion      Knee extension      Ankle dorsiflexion      Ankle plantarflexion      Ankle inversion      Ankle eversion         (Blank rows = not tested, N = WNL, * = concordant pain with testing)   Functional Tests   Eval (09/17/2022)                                                                                                              PATIENT SURVEYS:  Modified Oswestry 28/50      TODAY'S TREATMENT  Creating, reviewing, and completing below HEP   PATIENT EDUCATION:  POC, diagnosis, prognosis, HEP, and outcome measures.  Pt educated via explanation, demonstration, and  handout (HEP).  Pt confirms understanding verbally.    HOME EXERCISE PROGRAM: Access Code: [email or web address] URL: [email or web address] Date: 09/17/2022 Prepared by: Shearon Balo   Exercises - Supine Posterior Pelvic Tilt  - 2 x daily - 7 x weekly - 2 sets - 10 reps - 5'' hold - Seated Sciatic Tensioner  - 2 x daily - 7 x weekly - 15 reps - Prone Press Up On Elbows  - 2 x daily - 7 x weekly - 2 sets - 10 reps - Standing Lumbar Extension  - 2 x daily - 7 x weekly - 3 sets - 10 reps   ASTERISK SIGNS     Asterisk Signs Eval (09/17/2022)            Standing tolerance 10 min            pain 6-8/10            Forward flexion Causes pain  ASSESSMENT:   CLINICAL IMPRESSION: ***   OBJECTIVE IMPAIRMENTS: Pain, core and hip strength   ACTIVITY LIMITATIONS: standing, work, lifting, bending   PERSONAL FACTORS: See medical history and pertinent history        GOALS:     SHORT TERM GOALS: Target date: 10/15/2022   Kristen Sexton will be >75% HEP compliant to improve carryover between sessions and facilitate independent management of condition   Evaluation (09/17/2022): ongoing Goal status: INITIAL     LONG TERM GOALS: Target date: 11/12/2022   Kristen Sexton will shows a >/= 12 pt improvement in their ODI score (MCID is 12 pts) as a proxy for functional improvement    Evaluation/Baseline (09/17/2022): 56 pts (28/50) Goal status: INITIAL     2.  Kristen Sexton will self report >/= 50% decrease in pain from evaluation    Evaluation/Baseline (09/17/2022): 8/10 max pain Goal status: INITIAL     3.  Kristen Sexton will be able to lift 25 lbs from the floor and place on a 3 foot counter, not limited by pain    Evaluation/Baseline (09/17/2022): unable Goal status: INITIAL     4.  Kristen Sexton will be able to stand for 60 min for work, not limited by pain    Evaluation/Baseline (09/17/2022): 10 min Goal status: INITIAL       PLAN: PT FREQUENCY: 1-2x/week   PT  DURATION: 8 weeks (Ending 11/12/2022)   PLANNED INTERVENTIONS: Therapeutic exercises, Aquatic therapy, Therapeutic activity, Neuro Muscular re-education, Gait training, Patient/Family education, Joint mobilization, Dry Needling, Electrical stimulation, Spinal mobilization and/or manipulation, Moist heat, Taping, Vasopneumatic device, Ionotophoresis 4mg /ml Dexamethasone, and Manual therapy   PLAN FOR NEXT SESSION: progressive hip and core, nerve glides, ext based?   Vanessa Rusk, PT, DPT 09/26/22 3:15 PM

## 2022-09-26 NOTE — Telephone Encounter (Signed)
Spoke with pt regarding her missed appointment. Confirmed clinic attendance policy, next scheduled appointment, and clinic phone number. She reports understanding.

## 2022-09-30 NOTE — Progress Notes (Signed)
Adolescent Well Care Visit Kristen Sexton is a 22 y.o. female who is here for well care.     PCP:  Curtistine Pettitt, [place], MD   History was provided by the patient.  Confidentiality was discussed with the patient and, if applicable, with caregiver.  Patient's personal phone number: [phone]   Current Issues: Multiple questions today:  Late period - typically regular 28 to 30 day cycles.  Anticipated period around 1/6.  LMP 12/6.  Urine pregnancy test negative today.  Denies any current or prior sexual intercourse.  Plans for abstinence until marriage.  No contraception currently.    Spot over rim of R ear - tender when she brushes against it.  No drainage.  Can you check it?  Bloating - recurrent abdominal distension and bloating for about 1 year.  Associated with some sharp gas pain.  Stools alternate between constipation and looser, watery stools.  Sometimes related to meals. Eats a lot of carbs. What can I do to help it?  Hair loss - discussed last visit; deferred today due to multiple concerns + well care   Hyperpigmentation - gets insect bites or other small scrapes/injuries and her skin becomes hyperpigmented.  Why?  Also has hyperpigmentation under arms bilaterally.    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  Per her history, MRI showed "disc rupture" left L5.  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  Currently works as Administrator, arts at [employer], but long periods of time standing on feet are uncomfortable.  Advised to do PT.  Initial consult was 1/2.  Missed nect PT appt.  Next appt 1/17.  Feels like it is difficult to get to PT because she is working and studying for nursing exams.   Currently taking Gabapentin 300 mg TID - weaning down - taking this PRN (about twice over the  weekend)  Naproxen 500 mg BID with meal - taking PRN for period cramping  Flexeril 10 mg BID PRN -- (about 2 times over the weekend)  Tizanidine - last took it about a month ago  Prednisone - completed 5 day course   Tinea versicolor - restarted clotrimazole cream in Sept 2023 - plan for 2-4 weeks until clear.  Rash resolved, but she feels like it is recurring.  What can I do?   Prediabetes - Hgb A1c 6.1 at last visit.  Has not changed many of her dietary behaviors -- not sure what counts as a carbohydrate when she eats.    Nail dystrophy, paronychia of great toe R foot - seen by Podiatry 12/14 with probable proximal nail fold infection but no purulence -- started on clindamycin, Neomycin and soaks.  Did not return for follow-up.   This area is still tender to palpation.  She completed antibiotic.  No erythema or purulence -- just tender.    Nutrition: Nutrition/Eating Behaviors: snacks a lot while studying and between work shifts, likes chips.  Eats vegetables and protein.  Likes bread.  Less interested in fruit.  Adequate calcium in diet?: some milk  Supplements/ Vitamins: No   Exercise/ Media: Play any Sports?:  none Exercise:   rarely - planning to start going to a gym in early February  .  Used to go to a gym for weight lifting, core strengthening, and cardio.  Screen Time:  > 2 hours-counseling provided; on screens a lot while studying   Sleep:  Sleep: varies, ideally aims for 7-8 hours per day   Social Screening: Lives with:  parents and 2 younger siblings  Parental relations:  good Activities, Work, and Research officer, political party?: works at [employer], self-checkout attendant  Concerns regarding behavior with peers?  no  Education: School name: graduated from [employer], previously in [employer], now [employer] for nursing exam  School performance: doing well; no concerns  Menstruation:   Patient's last menstrual period was 08/21/2022. Menstrual History:  late period - see above.  No heavy  bleeding.  No prolonged bleeding.  Typically regular monthly cycles.    Dental Assessment: Patient has a dental home: yes  Confidential social history: Tobacco?  no Secondhand smoke exposure?  no Drugs/ETOH?  no  Sexually Active?  no   Pregnancy Prevention: abstinence   Safe at home, in school & in relationships? Yes Safe to self?  Yes  Screenings:  The patient completed the Rapid Assessment for Adolescent Preventive Services screening questionnaire and the following topics were identified as risk factors and discussed: healthy eating, exercise, and mental health issues  In addition, the following topics were discussed as part of anticipatory guidance: pregnancy prevention, depression/anxiety.  PHQ-9 completed and results indicated score 8, concern for mild depression.  No prior or current SI.  "Not difficult at all"   Physical Exam:  Vitals:   10/01/22 1010  BP: 114/70  Pulse: 100  SpO2: 97%  Weight: 187 lb 12.8 oz (85.2 kg)  Height: 5' 6.34" (1.685 m)   BP 114/70 (BP Location: Right Arm, Patient Position: Sitting, Cuff Size: Normal)   Pulse 100   Ht 5' 6.34" (1.685 m)   Wt 187 lb 12.8 oz (85.2 kg)   LMP 08/21/2022   SpO2 97%   BMI 30.00 kg/m  Body mass index: body mass index is 30 kg/m. Growth %ile SmartLinks can only be used for patients less than 64 years old.  Hearing Screening  Method: Audiometry   500Hz  1000Hz  2000Hz  4000Hz   Right ear 20 20 20 20   Left ear 20 20 20 20    Vision Screening   Right eye Left eye Both eyes  Without correction     With correction 20/20 20/20 20/20     General: well developed, no acute distress, gait normal HEENT: PERRL, normal oropharynx, TMs normal bilaterally Neck: supple, no lymphadenopathy CV: RRR no murmur noted PULM: normal aeration throughout all lung fields, no crackles or wheezes Abdomen: soft, non-tender; no masses or HSM Extremities: warm and well perfused GU: Normal female external genitalia, GU SMR stage 5,  and Breast SMR stage 5 . Exam completed with chaperone present.  Skin: scattered hyperpigmented macules over lower extremities bilaterally at site of insect bites/cuts, hyperpigmentation over bilateral axillae and posterior neck, some striae over abdomen, acrylics in place over toenails but no apparent erythema, swelling or purulence of nail folds of R hallux  Neuro: alert and oriented, moves all extremities equally   Assessment and Plan:  Diedre Maclellan is a 22 y.o. female who is here for well care.   Encounter for routine adult physical exam with abnormal findings  BMI 30.0-30.9,adult Prediabetes BMI elevated with rapid weight gain upward velocity (about 5 lb increase over last 5 months on our scales; last documented weight was in ED and 13 lbs less than today).  Weight gain likely secondary to excess caloric intake (lots of snacking) and very limited activity.  BP  appropriate for age.  Co-morbidities include prediabetes (with strong maternal history of diabetes).  POC HgbA1c increasing today.    - Counseled regarding increased risk for diabetes, HLD, HTN -  POCT glycosylated hemoglobin (Hb A1C) - 6.2 - Fasting lab evaluation:  -     Comprehensive metabolic panel -     Hemoglobin A1c -     Lipid panel - Referral to weight management  - Declined Nutrition referral because will be able to have access to this through weight management  - Consider Endocrinology referral if HgbA1c in diabetic range  - Will set goals next visit -- plans to restart time at gym on 2/1 after she finishes her nursing exam   Gas pain Bloating and gas pain could be related to her carb-rich diet.  Differential includes lactose intolerance, IBS (stools vary a lot), celiac.  - Avoid carbonated beverages, limit carbs  - Trial Mylicon per orders  -     simethicone (MYLICON) 80 MG chewable tablet; Chew 1 tablet (80 mg total) by mouth every 6 (six) hours as needed for flatulence. For gas pain and bloating.  Tinea  versicolor Improved from prior.  Could be recurrent because did not persist with maintenance phase.  Will trial antifungal shampoo.  -     ketoconazole (NIZORAL) 2 % shampoo; Apply 1 Application topically 2 (two) times a week. Apply for 4 to 8 weeks until resolved.  Then, apply just once per week to maintain results.  Skin pustule Small pustule over R external ear, triangular fossa.  No abscess.  -     mupirocin ointment (BACTROBAN) 2 %; Apply 1 Application topically 2 (two) times daily for 7 days.  Menstrual period late Could be complicated by insulin resistance/diabetes.  Pregnancy unlikely (negative urine pregnancy test today) and has never had sexual intercourse per history.   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  Denies current or prior SI.  - Recommend connection   At risk for nutrition deficiency Limited Vit D in diet.  -     VITAMIN D 25 Hydroxy (Vit-D Deficiency, Fractures)  Psychosocial stressors SDOH screen positive for food insecurity.   - Warm handoff today with social work - Out of the [employer], food resources  - Sports coach for pregnancy test, result unknown -     POCT urine pregnancy  Screening examination for venereal disease -     Urine cytology ancillary only  - pending   Encounter for screening for human immunodeficiency virus (HIV) -     POCT Rapid HIV - negative   Screening cholesterol level  Not previously completed.  Will plan for post-pubertal screen today.  She is fasting. -     Comprehensive metabolic panel -     Lipid panel  Nail dystrophy Normal exam of R hallux today -- no concern for acute paronychia or abscess.  -  Continue soaks.  Recommend scheduled follow-up with Podiatry as advised.  - No need for antibiotics now.  If purulence, can start mupirocin BID (Rx'd for ear today) for 5 day course while awaiting Podiatry appt  Need for vaccination Counseling provided for all of the following:  -     Flu Vaccine  QUAD 54mo+IM (Fluarix, Fluzone & Alfiuria Quad PF) Eligible for mening B - not currently in [employer] or dormitory setting, but will be applying to nursing school in future   Well teen: -Growth: BMI is not appropriate for age -Development: appropriate for age  -Social-Emotional: history and PHQ-9A concerning for mild depression  -Discussed anticipatory guidance including contraception, mental health, exercise, nutrition  -Hearing screening result:normal -Vision screening result: normal with correction  -STI screening completed -Blood pressure: appropriate for age  Wears glasses  Due for annual optometry exam.  She will call to schedule.   Acute bilateral low back pain with L-sided sciatica  Persistent pain, but has not completed physical therapy (initial session only).  Disc rupture at L5 per history (no records avail to review) - Continue PT as recommended by [facility]. - Advised to schedule f/u appt with [facility] to determine next steps if no improvement with PT and for med management  - Continue Gabapentin 300 mg TID as prescribed by [facility].  Recommend taking as scheduled medication given persistent pain.   - Currently using Flexeril 10 mg BID PRN (weaning off) as prescribed by [facility]   - Continue Naproxen PRN (with food)  - No longer taking tiznidine  - Consider Tylenol 500 mg Q6H scheduled as a base while still needing multiple other agents for persistent pain   Counseling for transition from pediatric to adult care provider  -Reviewed skills necessary for transition -- provided questionnaire  but did not complete  -Provided list of adult care providers.  Patient will research and reach out to schedule appt.   -Advised Alissia to send MyChart message to me if she would like referral to help facilitate transition   Need for vaccination:  -Counseling provided for all vaccine components  Orders Placed This Encounter  Procedures   Flu Vaccine QUAD 35mo+IM (Fluarix, Fluzone & Alfiuria Quad PF)   Comprehensive metabolic panel   Hemoglobin A1c   Lipid panel   VITAMIN D 25 Hydroxy (Vit-D Deficiency, Fractures)   POCT Rapid HIV   POCT glycosylated hemoglobin (Hb A1C)   POCT urine pregnancy     Return for f/u 3 mo for healthy lifestyles, prediabetes - 30 min.Enis Gash, MD [facility]   Significant time spent outside of routine well care to address acute concerns (tinea versicolor, sciatica, late period, gas pain/variable stools, skin pustule, hyperpigmentation).  Total face-to-face visit time with patient (including well care): 60 minutes

## 2022-10-01 ENCOUNTER — Ambulatory Visit: Payer: Medicaid Other

## 2022-10-01 ENCOUNTER — Encounter: Payer: Self-pay | Admitting: Pediatrics

## 2022-10-01 ENCOUNTER — Other Ambulatory Visit (HOSPITAL_COMMUNITY)
Admission: RE | Admit: 2022-10-01 | Discharge: 2022-10-01 | Disposition: A | Discharge status: Home / Self Care | Payer: Medicaid Other | Source: Ambulatory Visit | Attending: Pediatrics | Admitting: Pediatrics

## 2022-10-01 ENCOUNTER — Ambulatory Visit (INDEPENDENT_AMBULATORY_CARE_PROVIDER_SITE_OTHER): Payer: Medicaid Other | Admitting: Pediatrics

## 2022-10-01 VITALS — BP 114/70 | HR 100 | Ht 66.34 in | Wt 187.8 lb

## 2022-10-01 DIAGNOSIS — Z9189 Other specified personal risk factors, not elsewhere classified: Secondary | ICD-10-CM

## 2022-10-01 DIAGNOSIS — L089 Local infection of the skin and subcutaneous tissue, unspecified: Secondary | ICD-10-CM | POA: Diagnosis not present

## 2022-10-01 DIAGNOSIS — Z09 Encounter for follow-up examination after completed treatment for conditions other than malignant neoplasm: Secondary | ICD-10-CM

## 2022-10-01 DIAGNOSIS — Z1331 Encounter for screening for depression: Secondary | ICD-10-CM

## 2022-10-01 DIAGNOSIS — Z3200 Encounter for pregnancy test, result unknown: Secondary | ICD-10-CM

## 2022-10-01 DIAGNOSIS — Z973 Presence of spectacles and contact lenses: Secondary | ICD-10-CM

## 2022-10-01 DIAGNOSIS — Z3202 Encounter for pregnancy test, result negative: Secondary | ICD-10-CM

## 2022-10-01 DIAGNOSIS — Z0001 Encounter for general adult medical examination with abnormal findings: Secondary | ICD-10-CM | POA: Diagnosis not present

## 2022-10-01 DIAGNOSIS — Z131 Encounter for screening for diabetes mellitus: Secondary | ICD-10-CM | POA: Diagnosis not present

## 2022-10-01 DIAGNOSIS — Z7187 Encounter for pediatric-to-adult transition counseling: Secondary | ICD-10-CM | POA: Diagnosis not present

## 2022-10-01 DIAGNOSIS — M5442 Lumbago with sciatica, left side: Secondary | ICD-10-CM

## 2022-10-01 DIAGNOSIS — Z1339 Encounter for screening examination for other mental health and behavioral disorders: Secondary | ICD-10-CM

## 2022-10-01 DIAGNOSIS — Z113 Encounter for screening for infections with a predominantly sexual mode of transmission: Secondary | ICD-10-CM | POA: Insufficient documentation

## 2022-10-01 DIAGNOSIS — R141 Gas pain: Secondary | ICD-10-CM

## 2022-10-01 DIAGNOSIS — Z13220 Encounter for screening for lipoid disorders: Secondary | ICD-10-CM | POA: Diagnosis not present

## 2022-10-01 DIAGNOSIS — Z6830 Body mass index (BMI) 30.0-30.9, adult: Secondary | ICD-10-CM

## 2022-10-01 DIAGNOSIS — B360 Pityriasis versicolor: Secondary | ICD-10-CM

## 2022-10-01 DIAGNOSIS — Z658 Other specified problems related to psychosocial circumstances: Secondary | ICD-10-CM | POA: Diagnosis not present

## 2022-10-01 DIAGNOSIS — R7303 Prediabetes: Secondary | ICD-10-CM | POA: Diagnosis not present

## 2022-10-01 DIAGNOSIS — Z114 Encounter for screening for human immunodeficiency virus [HIV]: Secondary | ICD-10-CM

## 2022-10-01 DIAGNOSIS — N926 Irregular menstruation, unspecified: Secondary | ICD-10-CM

## 2022-10-01 DIAGNOSIS — L603 Nail dystrophy: Secondary | ICD-10-CM

## 2022-10-01 DIAGNOSIS — Z23 Encounter for immunization: Secondary | ICD-10-CM

## 2022-10-01 LAB — POCT GLYCOSYLATED HEMOGLOBIN (HGB A1C): Hemoglobin A1C: 6.2 % — AB (ref 4.0–5.6)

## 2022-10-01 LAB — POCT URINE PREGNANCY: Preg Test, Ur: NEGATIVE

## 2022-10-01 LAB — POCT RAPID HIV: Rapid HIV, POC: NEGATIVE

## 2022-10-01 MED ORDER — KETOCONAZOLE 2 % EX SHAM: 1.0000 | MEDICATED_SHAMPOO | CUTANEOUS | 0 refills | Status: DC

## 2022-10-01 MED ORDER — MUPIROCIN 2 % EX OINT: 1.0000 | TOPICAL_OINTMENT | Freq: Two times a day (BID) | CUTANEOUS | 0 refills | Status: AC

## 2022-10-01 MED ORDER — SIMETHICONE 80 MG PO CHEW: 80.0000 mg | CHEWABLE_TABLET | Freq: Four times a day (QID) | ORAL | 0 refills | Status: DC | PRN

## 2022-10-01 NOTE — Patient Instructions (Signed)
   As your medical provider, it is important to me that you continue to receive high-quality primary care services as you transition to adulthood.  After the age of 76, you can no longer be seen at the [facility] for your primary care health services.   Below is a list of adult medicine practices that are currently accepting new patients.  Please reach out to one of these practices to schedule a new patient appointment as soon as possible.  Please be aware that you will not be able to be seen at our office after your 23nd birthday.   Adult Primary Care Clinics Name [facility]  Address: [place], [place] [address]  Phone: [phone] Hours: Monday - Friday 9 AM -6 PM  Types of insurance accepted:  Commercial insurance [place] (orange card) [employer] Uninsured  Language services:  Video and phone interpreters available   Ages 67 and older    Adult primary care Onsite pharmacy Integrated behavioral health Financial assistance counseling Walk-in hours for established patients  Financial assistance counseling hours: Tuesdays 2:00PM - 5:00PM  Thursday 8:30AM - 4:30PM  Space is limited, 10 on Tuesday and 20 on Thursday on a first come, first serve basis  Name [place] [place]  Address: [place], [place] [address]  Phone: [phone]  Hours: Monday - Friday 8:30 AM - 5 PM  Types of insurance accepted:  Pharmacist, community Medicaid Medicare Uninsured  Language services:  Video and phone interpreters available   All ages - newborn to adult   Primary care for all ages (children and adults) Integrated behavioral health Nutritionist Financial assistance counseling   Name [place] on the ground floor of  [facility]  Address: [address],  [place]  [address]  Phone: [phone]  Hours: Monday - Friday 8:15 AM - 5 PM  Types of insurance accepted:  Commercial insurance Medicaid Medicare Uninsured  Language services:  Video and phone interpreters available   Ages 63 and older   Adult primary care Nutritionist Certified Diabetes Educator  Integrated behavioral health Financial assistance counseling   Name [facility]  Address: [address] [place], [place] [address]  Phone: [phone]  Hours: Monday - Friday 8:30 AM - 5 PM    Types of insurance accepted:  Pharmacist, community Medicaid Medicare Uninsured  Language services:  Video and phone interpreters available   All ages - newborn to adult   Primary care for all ages (children and adults) Integrated behavioral health Financial assistance counseling    Make an appointment with [facility].  Continue with physical therapy.   Prescriptions to pick up  Ketoconazole - apply two times per week for 4 weeks.  Then use just once per week for maintenance. Mylicon - use up to every 6 hours as needed for gas pain  Mupirocin - apply to spot on ear TWO times per day for 5 to 7 days   Weight management referral - their office will call you to schedule an appointment.   I will call you with lab results later this week.   Let me know if you still haven't had a period in 3 weeks.

## 2022-10-02 ENCOUNTER — Encounter: Payer: Self-pay | Admitting: Physical Therapy

## 2022-10-02 ENCOUNTER — Ambulatory Visit: Payer: Medicaid Other | Admitting: Physical Therapy

## 2022-10-02 DIAGNOSIS — M5459 Other low back pain: Secondary | ICD-10-CM

## 2022-10-02 DIAGNOSIS — M6281 Muscle weakness (generalized): Secondary | ICD-10-CM

## 2022-10-02 LAB — URINE CYTOLOGY ANCILLARY ONLY
Chlamydia: NEGATIVE
Comment: NEGATIVE
Comment: NORMAL
Neisseria Gonorrhea: NEGATIVE

## 2022-10-02 LAB — COMPREHENSIVE METABOLIC PANEL
AG Ratio: 1.7 (calc) (ref 1.0–2.5)
ALT: 18 U/L (ref 6–29)
AST: 15 U/L (ref 10–30)
Albumin: 4.7 g/dL (ref 3.6–5.1)
Alkaline phosphatase (APISO): 73 U/L (ref 31–125)
BUN: 14 mg/dL (ref 7–25)
CO2: 26 mmol/L (ref 20–32)
Calcium: 9.5 mg/dL (ref 8.6–10.2)
Chloride: 104 mmol/L (ref 98–110)
Creat: 0.66 mg/dL (ref 0.50–0.96)
Globulin: 2.8 g/dL (calc) (ref 1.9–3.7)
Glucose, Bld: 99 mg/dL (ref 65–99)
Potassium: 4.1 mmol/L (ref 3.5–5.3)
Sodium: 137 mmol/L (ref 135–146)
Total Bilirubin: 0.5 mg/dL (ref 0.2–1.2)
Total Protein: 7.5 g/dL (ref 6.1–8.1)

## 2022-10-02 LAB — LIPID PANEL
Cholesterol: 151 mg/dL (ref <200)
HDL: 46 mg/dL — ABNORMAL LOW (ref >=50)
LDL Cholesterol (Calc): 88 mg/dL (calc)
Non-HDL Cholesterol (Calc): 105 mg/dL (calc) (ref <130)
Total CHOL/HDL Ratio: 3.3 (calc) (ref <5.0)
Triglycerides: 82 mg/dL (ref <150)

## 2022-10-02 LAB — HEMOGLOBIN A1C
Hgb A1c MFr Bld: 6.4 % of total Hgb — ABNORMAL HIGH (ref <5.7)
Mean Plasma Glucose: 137 mg/dL
eAG (mmol/L): 7.6 mmol/L

## 2022-10-02 LAB — VITAMIN D 25 HYDROXY (VIT D DEFICIENCY, FRACTURES): Vit D, 25-Hydroxy: 8 ng/mL — ABNORMAL LOW (ref 30–100)

## 2022-10-02 NOTE — Therapy (Signed)
OUTPATIENT PHYSICAL THERAPY TREATMENT NOTE   Patient Name: Kristen Sexton MRN: [id number] DOB:2000-12-13, 22 y.o., female Today's Date: 10/02/2022  PCP: Hanvey, [place], MD   REFERRING PROVIDER: Hanvey, [place], MD   PT End of Session - 10/02/22 1612     Visit Number 2    Number of Visits --   1-2x/week   Date for PT Re-Evaluation 11/12/22    Authorization Type UHC MCD - ODI    PT Start Time 0415    PT Stop Time 0456    PT Time Calculation (min) 41 min             Past Medical History:  Diagnosis Date   Allergy    Environmental allergies    Pre-diabetes    Past Surgical History:  Procedure Laterality Date   NO PAST SURGERIES     Patient Active Problem List   Diagnosis Date Noted   Prediabetes 05/21/2022   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 (Pt reports that this was confirmed with MRI)   PRECAUTIONS/RESTRICTIONS:   none  SUBJECTIVE:  Pt reports that she has been HEP compliant.  She states that bridges are difficult and causes some pain in the moment, but does not last.  She states that ext based movements have been helpful.  She would like to transfer to [facility].  Pain:  Are you having pain? Yes Pain location: L sided low back and L LE pain NPRS scale:  6/10 to 8/10 Aggravating factors: standing (10 min) Relieving factors: sitting Pain description: constant, burning,  and aching Stage: Chronic Stability: getting worse 24 hour pattern: better in morning   OBJECTIVE: (objective measures completed at initial evaluation unless otherwise dated)  DIAGNOSTIC FINDINGS:  X-rays (-), pt states MRI shows ruptured disk L L5   SENSATION:          Light touch: Deficits diminished throughout L LE   MUSCLE LENGTH: Hamstrings: Right no restriction; Left no restriction ASLR: Right ASLR = PSLR; Left ASLR = PSLR Thomas test: Right no restriction; Left no restriction   LUMBAR AROM   AROM AROM  09/17/2022  Flexion Fingertips to toes (WNL), w/ concordant pain  Extension WNL, w/ no pain  Right lateral flexion WNL  Left lateral flexion WNL  Right rotation WNL  Left rotation WNL, w/ concordant pain    (Blank rows = not tested)     LUMBAR SPECIAL TESTS:  Straight leg raise: L (+), R (-) Slump: L (+), R (-)   LE MMT:   MMT Right 09/17/2022 Left 09/17/2022  Hip flexion (L2, L3) 4 4*  Knee extension (L3) 4+ 3+*  Knee flexion 4 4  Hip abduction 3 non concordant pain 3 non concordant pain  Hip extension      Hip external rotation      Hip internal rotation      Hip adduction      Ankle dorsiflexion (L4) c c*  Ankle plantarflexion (S1) c c  Ankle inversion      Ankle eversion      Great Toe ext (L5) c c  Grossly        (Blank rows = not tested, score listed is out of 5 possible points.  N = WNL, D = diminished, C = clear for gross weakness with myotome testing, * = concordant pain with testing)     LE ROM:   ROM Right 09/17/2022 Left 09/17/2022  Hip flexion      Hip extension      Hip abduction      Hip adduction      Hip internal rotation      Hip external rotation      Knee flexion      Knee extension      Ankle dorsiflexion      Ankle plantarflexion      Ankle inversion      Ankle eversion         (Blank rows = not tested, N = WNL, * = concordant pain with testing)   Functional Tests   Eval (09/17/2022)                                                                                                               PATIENT SURVEYS:  Modified Oswestry 28/50      TODAY'S TREATMENT  Creating, reviewing, and completing below HEP   PATIENT EDUCATION:  POC, diagnosis, prognosis, HEP, and outcome measures.  Pt educated via explanation, demonstration, and handout (HEP).  Pt confirms understanding verbally.    HOME EXERCISE PROGRAM: Access Code: [email or web address] URL: [email or web address] Date: 09/17/2022 Prepared by: Shearon Balo   Exercises - Supine Posterior Pelvic Tilt  - 2 x daily - 7 x weekly - 2 sets - 10 reps - 5'' hold - Seated Sciatic Tensioner  - 2 x daily - 7 x weekly - 15 reps - Prone Press Up On Elbows  - 2 x daily - 7 x weekly - 2 sets - 10 reps - Standing Lumbar Extension  - 2 x daily - 7 x weekly - 3 sets - 10 reps   ASTERISK SIGNS     Asterisk Signs Eval (09/17/2022)            Standing tolerance 10 min            pain 6-8/10            Forward flexion Causes pain                                               TREATMENT 1/17:  Therapeutic Exercise: - nu-step L5 33m while taking subjective and planning session with patient - LTR - 20x  - alternating clam in supine - blue TB - 3x10 - hip adduction squeeze - 5'' 2x10 - supine bridge - 3x10 - Sciatic nerve glide (L) -  2x20 - supine - Supine abdominal isometric squeeze with swiss ball - 2x10 - 5'' - prone press up  Manual Therapy: - CPA and UPA lower lumbar spine, pt in prone, G III-IV   ASSESSMENT:   CLINICAL IMPRESSION: Kristen Sexton tolerated session well with no adverse reaction.  We concentrated on mat core and hip strengthening to good effect.  Prone press-press up and Manual CPA and UPA reduced sxs.  Will continue with strengthening + ext based program.   OBJECTIVE IMPAIRMENTS: Pain, core and hip strength   ACTIVITY LIMITATIONS: standing, work, lifting, bending   PERSONAL FACTORS: See medical history and pertinent history      REHAB POTENTIAL: Good   CLINICAL DECISION MAKING: Stable/uncomplicated   EVALUATION COMPLEXITY: Low     GOALS:     SHORT TERM GOALS: Target date: 10/15/2022   Kristen Sexton will be >75% HEP compliant to improve carryover between sessions and facilitate independent management of condition   Evaluation (09/17/2022): ongoing Goal status: INITIAL     LONG TERM GOALS: Target date: 11/12/2022   Kristen Sexton will shows a >/= 12 pt improvement in their ODI score (MCID is 12 pts) as a proxy for functional improvement    Evaluation/Baseline (09/17/2022): 56 pts (28/50) Goal status: INITIAL     2.  Kristen Sexton will self report >/= 50% decrease in pain from evaluation    Evaluation/Baseline (09/17/2022): 8/10 max pain Goal status: INITIAL     3.  Kristen Sexton will be able to lift 25 lbs from the floor and place on a 3 foot counter, not limited by pain    Evaluation/Baseline (09/17/2022): unable Goal status: INITIAL     4.  Kristen Sexton will be able to stand for 60 min for work, not limited by pain    Evaluation/Baseline (09/17/2022): 10 min Goal status: INITIAL       PLAN: PT FREQUENCY: 1-2x/week   PT DURATION: 8 weeks (Ending 11/12/2022)   PLANNED INTERVENTIONS: Therapeutic exercises, Aquatic therapy, Therapeutic activity, Neuro Muscular re-education, Gait training, Patient/Family education, Joint mobilization, Dry Needling, Electrical stimulation, Spinal mobilization and/or manipulation, Moist heat, Taping, Vasopneumatic device, Ionotophoresis 4mg /ml Dexamethasone, and Manual therapy   PLAN FOR NEXT SESSION: progressive hip and core, nerve glides, ext based?   Kevan Ny Awa Bachicha PT 10/02/2022, 4:56 PM

## 2022-10-03 ENCOUNTER — Ambulatory Visit: Payer: Medicaid Other

## 2022-10-03 DIAGNOSIS — Z1331 Encounter for screening for depression: Secondary | ICD-10-CM | POA: Insufficient documentation

## 2022-10-03 DIAGNOSIS — L603 Nail dystrophy: Secondary | ICD-10-CM | POA: Insufficient documentation

## 2022-10-03 DIAGNOSIS — Z973 Presence of spectacles and contact lenses: Secondary | ICD-10-CM | POA: Insufficient documentation

## 2022-10-03 DIAGNOSIS — Z6830 Body mass index (BMI) 30.0-30.9, adult: Secondary | ICD-10-CM | POA: Insufficient documentation

## 2022-10-03 DIAGNOSIS — M5442 Lumbago with sciatica, left side: Secondary | ICD-10-CM | POA: Insufficient documentation

## 2022-10-03 DIAGNOSIS — R141 Gas pain: Secondary | ICD-10-CM | POA: Insufficient documentation

## 2022-10-03 DIAGNOSIS — Z658 Other specified problems related to psychosocial circumstances: Secondary | ICD-10-CM | POA: Insufficient documentation

## 2022-10-04 ENCOUNTER — Ambulatory Visit: Payer: Medicaid Other | Admitting: Physical Therapy

## 2022-10-04 ENCOUNTER — Other Ambulatory Visit: Payer: Self-pay | Admitting: Pediatrics

## 2022-10-04 DIAGNOSIS — E559 Vitamin D deficiency, unspecified: Secondary | ICD-10-CM

## 2022-10-04 MED ORDER — VITAMIN D (ERGOCALCIFEROL) 1.25 MG (50000 UNIT) PO CAPS: 50000.0000 [IU] | ORAL_CAPSULE | ORAL | 0 refills | Status: AC

## 2022-10-04 MED ORDER — VITAMIN D (ERGOCALCIFEROL) 50 MCG (2000 UT) PO CAPS: 2000.0000 [IU] | ORAL_CAPSULE | Freq: Every day | ORAL | 5 refills | Status: DC

## 2022-10-05 ENCOUNTER — Telehealth: Payer: Self-pay | Admitting: Pediatrics

## 2022-10-05 ENCOUNTER — Ambulatory Visit: Payer: Medicaid Other | Admitting: Physical Therapy

## 2022-10-05 DIAGNOSIS — Z6830 Body mass index (BMI) 30.0-30.9, adult: Secondary | ICD-10-CM

## 2022-10-05 DIAGNOSIS — E785 Hyperlipidemia, unspecified: Secondary | ICD-10-CM

## 2022-10-05 DIAGNOSIS — R7303 Prediabetes: Secondary | ICD-10-CM

## 2022-10-05 DIAGNOSIS — E559 Vitamin D deficiency, unspecified: Secondary | ICD-10-CM

## 2022-10-05 NOTE — Telephone Encounter (Signed)
Late entry.  Results reviewed with patient over the phone on 1/19.  Hemoglobin A1c 6.4, slightly greater than POCT this week.   Vitamin D deficiency - 9  HDL slightly low, lipid panel otherwise normal Normal CMP Negative STI testing - GC/CT, HIV  Assessment/Plan Vitamin D deficiency -advised high-dose weekly vitamin D x 8 weeks followed by transition to maintenance vitamin D 2000 IU. Rx sent.  Patient aware that maintenance vitamin D is OTC and likely not covered by insurance.  Prediabetes -referral to Adult Endocrinology.  Referral to weight management program sent earlier this week.  Nutrition and lifestyle changes reviewed at recent well visit.  We emphasized these today over the phone.   Dyslipidemia -will likely see Nutrition as part of weight management program, but patient requests separate referral to nutrition in case she cannot commit to weight management program soon.  Referral placed today.  Halina Maidens, MD [facility]

## 2022-10-08 ENCOUNTER — Ambulatory Visit: Payer: Medicaid Other | Admitting: Physical Therapy

## 2022-10-09 ENCOUNTER — Ambulatory Visit: Payer: Medicaid Other | Admitting: Physical Therapy

## 2022-10-10 ENCOUNTER — Ambulatory Visit: Payer: Medicaid Other

## 2022-10-11 ENCOUNTER — Ambulatory Visit: Payer: Medicaid Other | Admitting: Physical Therapy

## 2022-10-14 NOTE — Progress Notes (Signed)
CASE MANAGEMENT VISIT  Session Start time: 11:30am  Session End time: 11:45am Total time: 15 minutes  Type of Service:CASE MANAGEMENT Interpretor:No. Interpretor Name and Language:    Summary of Today's Visit:  SWCM met with pt to discuss food pantries and applying for EBT/SNAP benefits. Resource handouts provided, and pt to talk to mother about applying for EBT due to pt and siblings still living at home and in college/high school.   Plan for Next Visit:  f/u as needed.    Shaune Pollack, BSW, QP Social Work Case Programmer, multimedia and [employer] [facility] Office: [phone] Direct Number: [phone]    Timoteo Ace

## 2022-10-15 ENCOUNTER — Encounter: Payer: Medicaid Other | Admitting: Physical Therapy

## 2022-10-16 ENCOUNTER — Encounter: Payer: Self-pay | Admitting: Physical Therapy

## 2022-10-16 ENCOUNTER — Ambulatory Visit: Payer: Medicaid Other | Admitting: Physical Therapy

## 2022-10-16 DIAGNOSIS — M6281 Muscle weakness (generalized): Secondary | ICD-10-CM

## 2022-10-16 DIAGNOSIS — M5459 Other low back pain: Secondary | ICD-10-CM

## 2022-10-16 NOTE — Therapy (Signed)
OUTPATIENT PHYSICAL THERAPY TREATMENT NOTE   Patient Name: Kristen Sexton MRN: [id number] DOB:[birth year], 22 y.o., female Today's Date: 10/16/2022  PCP: Hanvey, [place], MD   REFERRING PROVIDER: Hanvey, [place], MD   PT End of Session - 10/16/22 1704     Visit Number 3    Date for PT Re-Evaluation 11/12/22    Authorization Type UHC MCD - ODI    PT Start Time 1700    PT Stop Time 1745    PT Time Calculation (min) 45 min    Activity Tolerance Patient tolerated treatment well    Behavior During Therapy [facility] for tasks assessed/performed             Past Medical History:  Diagnosis Date   Allergy    Environmental allergies    Pre-diabetes    Past Surgical History:  Procedure Laterality Date   NO PAST SURGERIES     Patient Active Problem List   Diagnosis Date Noted   BMI 30.0-30.9,adult 10/03/2022   Positive depression screening 10/03/2022   Wears glasses 10/03/2022   Acute bilateral low back pain with left-sided sciatica 10/03/2022   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   Psychosocial stressors 10/03/2022   Gas pain 10/03/2022   Prediabetes 05/21/2022   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 (Pt reports that this was confirmed with MRI)   PRECAUTIONS/RESTRICTIONS:   none  SUBJECTIVE:  Pt reports that she has been HEP compliant. She feels that when she does the exercises it has helped, she  reports a very busy time in her life with school and exams to get into nursing program, has her finals next week and then will be able to focus more on the treatment, she does report some better  Pain:  Are you having pain? Yes Pain location: L sided low back and L LE pain NPRS scale:  5/10 to 8/10 Aggravating factors: standing  > 15 minutes Relieving factors: sitting Pain description: constant, burning, and aching Stage: Chronic Stability: getting worse 24 hour pattern: better in morning  She currently works a job that she is standing >6 hours  OBJECTIVE: (objective measures completed at initial evaluation unless otherwise dated)  DIAGNOSTIC FINDINGS:  X-rays (-), pt states MRI shows ruptured disk L L5   SENSATION:          Light touch: Deficits diminished throughout L LE   MUSCLE LENGTH: Hamstrings: Right no restriction; Left no restriction ASLR: Right ASLR = PSLR; Left ASLR = PSLR Thomas test: Right no restriction; Left no restriction   LUMBAR AROM   AROM AROM  09/17/2022  Flexion Fingertips to toes (WNL), w/ concordant pain  Extension WNL, w/ no pain  Right lateral flexion WNL  Left lateral flexion WNL  Right rotation WNL  Left rotation WNL, w/ concordant pain    (Blank rows = not tested)     LUMBAR SPECIAL TESTS:  Straight leg raise: L (+), R (-) Slump: L (+), R (-)  LE MMT:   MMT Right 09/17/2022 Left 09/17/2022  Hip flexion (L2, L3) 4 4*  Knee extension (L3) 4+ 3+*  Knee flexion 4 4  Hip abduction 3 non concordant pain 3 non concordant pain  Hip extension      Hip external rotation      Hip internal rotation      Hip adduction      Ankle dorsiflexion (L4) c c*  Ankle plantarflexion (S1) c c  Ankle inversion      Ankle eversion      Great Toe ext (L5) c c  Grossly        (Blank rows = not tested, score listed is out of 5 possible points.  N = WNL, D = diminished, C = clear for gross weakness with myotome testing, * = concordant pain with testing)      LE ROM:   ROM Right 09/17/2022 Left 09/17/2022  Hip flexion      Hip extension      Hip abduction      Hip adduction      Hip internal rotation      Hip external rotation      Knee flexion      Knee extension      Ankle dorsiflexion      Ankle plantarflexion      Ankle inversion      Ankle eversion         (Blank rows = not tested, N = WNL, * = concordant pain with testing)   Functional Tests   Eval (09/17/2022)                                                                                                              PATIENT SURVEYS:  Modified Oswestry 28/50     10/16/22 20/56 improved from eval     PATIENT EDUCATION:  POC, diagnosis, prognosis, HEP, and outcome measures.  Pt educated via explanation, demonstration, and handout (HEP).  Pt confirms understanding verbally.    HOME EXERCISE PROGRAM: Access Code: [email or web address] URL: [email or web address] Date: 10/16/2022 Prepared by: Stacie Glaze  Exercises - Supine Piriformis Stretch Pulling Heel to Hip  - 2 x daily - 7 x weekly - 1 sets - 5 reps - 30 hold   Access Code: [email or web address] URL: [email or web address] Date: 09/17/2022 Prepared by: Alphonzo Severance   Exercises - Supine Posterior Pelvic Tilt  - 2 x daily - 7 x weekly - 2 sets - 10 reps - 5'' hold - Seated Sciatic Tensioner  - 2 x daily - 7 x weekly - 15 reps - Prone Press Up On Elbows  - 2 x daily - 7 x weekly - 2 sets - 10 reps - Standing Lumbar Extension  - 2 x daily - 7 x weekly - 3 sets - 10 reps   ASTERISK SIGNS     Asterisk Signs Eval (09/17/2022) 10/16/22           Standing tolerance 10 min  25  pain 6-8/10  5-8          Forward flexion Causes pain  pain                                            TODAY'S TREATMENT  10/16/22 Bike level 4 x 4 minutes Posture and body mechanics instruction for patient care, transfer patient from chair to bed, rolling patient and patient from supine to sit Nustep level 5  x 4 minutes Seated row 20# 2x10 Lats 20# 2x10 Passive HS and piriformis stretch Discussion of anatomy and the disc issue and how the extension exercises will help [place] position    TREATMENT 1/17:  Therapeutic Exercise: - nu-step L5 21m while taking subjective and planning session with patient - LTR - 20x  - alternating clam in supine - blue TB - 3x10 - hip adduction squeeze - 5'' 2x10 - supine bridge - 3x10 - Sciatic nerve glide (L) - 2x20 - supine - Supine abdominal isometric squeeze with swiss ball - 2x10 - 5'' - prone press up  Manual Therapy: - CPA and UPA lower lumbar spine, pt in prone, G III-IV   ASSESSMENT:   CLINICAL IMPRESSION: sheree lalla is doing better, she is at a very busy time in her life with work and exams to get into nursing school.  Her pain is a little less than at eval, her standing tolerance is improved and her oswestry score is improved to less disability.  She still has the biggest issue with standing, still with some left sciatica.  WE are starting gym and posture and body mechanics for her safety and overall health  OBJECTIVE IMPAIRMENTS: Pain, core and hip strength   ACTIVITY LIMITATIONS: standing, work, lifting, bending   PERSONAL FACTORS: See medical history and pertinent history     REHAB POTENTIAL: Good   CLINICAL DECISION MAKING: Stable/uncomplicated   EVALUATION COMPLEXITY: Low     GOALS:     SHORT TERM GOALS: Target date: 10/15/2022   Nickie will be >75% HEP compliant to improve carryover between sessions and facilitate independent management of condition   Evaluation (09/17/2022): ongoing Goal status:met 10/16/22     LONG TERM GOALS: Target date: 11/12/2022   Regnia will shows a >/= 12 pt improvement in their ODI score (MCID is 12 pts) as a proxy for functional improvement    Evaluation/Baseline (09/17/2022): 56 pts (28/50) Goal status: 10/16/22 progressing 8 point improvement     2.  Shanterica will self report >/= 50% decrease  in pain from evaluation    Evaluation/Baseline (09/17/2022): 8/10 max pain Goal status: ongoing     3.  Lesleyann will be able to lift 25 lbs from the floor and place on a 3 foot counter, not limited by pain    Evaluation/Baseline (09/17/2022): unable Goal status: ongoing     4.  Zanasia will be able to stand for 60 min for work, not limited by pain    Evaluation/Baseline (09/17/2022): 10 min Goal status: 25 minutes 10/16/22 progressing       PLAN: PT FREQUENCY: 1-2x/week   PT DURATION: 8 weeks (Ending 11/12/2022)   PLANNED INTERVENTIONS: Therapeutic exercises, Aquatic therapy, Therapeutic activity, Neuro Muscular re-education, Gait training, Patient/Family education, Joint mobilization, Dry Needling, Electrical stimulation, Spinal mobilization and/or manipulation, Moist heat, Taping, Vasopneumatic device, Ionotophoresis 4mg /ml Dexamethasone, and Manual therapy   PLAN FOR NEXT SESSION: progressive hip  and core, nerve glides, ext based?   Sumner Boast PT 10/16/2022, 5:05 PM

## 2022-10-18 ENCOUNTER — Encounter: Payer: Medicaid Other | Admitting: Physical Therapy

## 2022-10-22 ENCOUNTER — Encounter: Payer: Medicaid Other | Admitting: Physical Therapy

## 2022-10-25 ENCOUNTER — Encounter: Payer: Medicaid Other | Admitting: Physical Therapy

## 2022-10-28 ENCOUNTER — Ambulatory Visit: Payer: Medicaid Other | Attending: Pediatrics | Admitting: Physical Therapy

## 2022-10-28 ENCOUNTER — Encounter: Payer: Self-pay | Admitting: Physical Therapy

## 2022-10-28 DIAGNOSIS — M5459 Other low back pain: Secondary | ICD-10-CM | POA: Insufficient documentation

## 2022-10-28 DIAGNOSIS — M6281 Muscle weakness (generalized): Secondary | ICD-10-CM | POA: Insufficient documentation

## 2022-10-28 NOTE — Therapy (Signed)
OUTPATIENT PHYSICAL THERAPY TREATMENT NOTE   Patient Name: Thyda Maberry MRN: [id number] DOB:[birth year], 22 y.o., female Today's Date: 10/28/2022  PCP: Hanvey, [place], MD   REFERRING PROVIDER: Hanvey, [place], MD  PT End of Session - 10/28/22 1637     Visit Number 4    Date for PT Re-Evaluation 11/12/22    PT Start Time 1632    PT Stop Time 1710    PT Time Calculation (min) 38 min    Activity Tolerance Patient tolerated treatment well    Behavior During Therapy [facility] for tasks assessed/performed              Past Medical History:  Diagnosis Date   Allergy    Environmental allergies    Pre-diabetes    Past Surgical History:  Procedure Laterality Date   NO PAST SURGERIES     Patient Active Problem List   Diagnosis Date Noted   BMI 30.0-30.9,adult 10/03/2022   Positive depression screening 10/03/2022   Wears glasses 10/03/2022   Acute bilateral low back pain with left-sided sciatica 10/03/2022   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   Psychosocial stressors 10/03/2022   Gas pain 10/03/2022   Prediabetes 05/21/2022   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 (Pt reports that this was confirmed with MRI)   PRECAUTIONS/RESTRICTIONS:   none  SUBJECTIVE:  Pt reports that she did well on her exams and has now applied to multiple nursing schools. Her back is much improved. She had to work x 5 hours at  [employer] with minimal pain  Pain:  Are you having pain? Yes Pain location: L sided low back and L LE pain NPRS scale:  5/10 to 8/10 Aggravating factors: standing  > 15 minutes Relieving factors: sitting Pain description: constant, burning, and aching Stage: Chronic Stability: getting worse 24 hour pattern: better in morning  She currently works a job that she is standing >6 hours  OBJECTIVE: (objective measures completed at initial evaluation unless otherwise dated)  DIAGNOSTIC FINDINGS:  X-rays (-), pt states MRI shows ruptured disk L L5   SENSATION:          Light touch: Deficits diminished throughout L LE   MUSCLE LENGTH: Hamstrings: Right no restriction; Left no restriction ASLR: Right ASLR = PSLR; Left ASLR = PSLR Thomas test: Right no restriction; Left no restriction   LUMBAR AROM   AROM AROM  09/17/2022  Flexion Fingertips to toes (WNL), w/ concordant pain  Extension WNL, w/ no pain  Right lateral flexion WNL  Left lateral flexion WNL  Right rotation WNL  Left rotation WNL, w/ concordant pain    (Blank rows = not tested)     LUMBAR SPECIAL TESTS:  Straight leg raise: L (+), R (-) Slump: L (+), R (-)   LE MMT:   MMT Right 09/17/2022 Left 09/17/2022  Hip flexion (L2, L3) 4 4*  Knee extension (L3) 4+ 3+*  Knee flexion 4 4  Hip  abduction 3 non concordant pain 3 non concordant pain  Hip extension      Hip external rotation      Hip internal rotation      Hip adduction      Ankle dorsiflexion (L4) c c*  Ankle plantarflexion (S1) c c  Ankle inversion      Ankle eversion      Great Toe ext (L5) c c  Grossly        (Blank rows = not tested, score listed is out of 5 possible points.  N = WNL, D = diminished, C = clear for gross weakness with myotome testing, * = concordant pain with testing)     LE ROM:   ROM Right 09/17/2022 Left 09/17/2022  Hip flexion      Hip extension      Hip abduction      Hip adduction      Hip internal rotation      Hip  external rotation      Knee flexion      Knee extension      Ankle dorsiflexion      Ankle plantarflexion      Ankle inversion      Ankle eversion         (Blank rows = not tested, N = WNL, * = concordant pain with testing)   Functional Tests   Eval (09/17/2022)                                                                                                              PATIENT SURVEYS:  Modified Oswestry 28/50     10/16/22 20/56 improved from eval     PATIENT EDUCATION:  POC, diagnosis, prognosis, HEP, and outcome measures.  Pt educated via explanation, demonstration, and handout (HEP).  Pt confirms understanding verbally.    HOME EXERCISE PROGRAM: Access Code: [email or web address] URL: [email or web address] Date: 10/16/2022 Prepared by: Lum Babe  Exercises - Supine Piriformis Stretch Pulling Heel to Hip  - 2 x daily - 7 x weekly - 1 sets - 5 reps - 30 hold   Access Code: [email or web address] URL: [email or web address] Date: 09/17/2022 Prepared by: Shearon Balo   Exercises - Supine Posterior Pelvic Tilt  - 2 x daily - 7 x weekly - 2 sets - 10 reps - 5'' hold - Seated Sciatic Tensioner  - 2 x daily - 7 x weekly - 15 reps - Prone Press Up On Elbows  - 2 x daily - 7 x weekly - 2 sets - 10 reps - Standing Lumbar Extension  - 2 x daily - 7 x weekly - 3 sets - 10 reps   ASTERISK SIGNS     Asterisk Signs Eval (09/17/2022) 10/16/22           Standing tolerance 10 min  25          pain 6-8/10  5-8          Forward flexion Causes pain  pain  TODAY'S TREATMENT  10/28/22 NuStep L5 x 6 minutes Reviewed Neural glides. Patient reports they are very painful. Modified into seated HS stretch with ankle pumps for more gentle mobilization. Supine pelvic tilt into bridge x 10 Supine clamshell against G tband x 10 Bridge with Green Tband abd resistance at knees x 5 Supine pelvic tilt, marching legs. Sit <>  stand x 10, no UE support B side stepping against G tband resistance 4 x 5 reps Heel raises x 10  10/16/22 Bike level 4 x 4 minutes Posture and body mechanics instruction for patient care, transfer patient from chair to bed, rolling patient and patient from supine to sit Nustep level 5 x 4 minutes Seated row 20# 2x10 Lats 20# 2x10 Passive HS and piriformis stretch Discussion of anatomy and the disc issue and how the extension exercises will help [place] position    TREATMENT 1/17:  Therapeutic Exercise: - nu-step L5 85mwhile taking subjective and planning session with patient - LTR - 20x  - alternating clam in supine - blue TB - 3x10 - hip adduction squeeze - 5'' 2x10 - supine bridge - 3x10 - Sciatic nerve glide (L) - 2x20 - supine - Supine abdominal isometric squeeze with swiss ball - 2x10 - 5'' - prone press up  Manual Therapy: - CPA and UPA lower lumbar spine, pt in prone, G III-IV   ASSESSMENT:   CLINICAL IMPRESSION: Updated patient's HEP with additional strength and stretching activities, focused primarily on lower trunk. She tolerated all, required only occasional VC for posture.  OBJECTIVE IMPAIRMENTS: Pain, core and hip strength   ACTIVITY LIMITATIONS: standing, work, lifting, bending   PERSONAL FACTORS: See medical history and pertinent history     REHAB POTENTIAL: Good   CLINICAL DECISION MAKING: Stable/uncomplicated   EVALUATION COMPLEXITY: Low     GOALS:     SHORT TERM GOALS: Target date: 10/15/2022   SVallorywill be >75% HEP compliant to improve carryover between sessions and facilitate independent management of condition   Evaluation (09/17/2022): ongoing Goal status:met 10/16/22     LONG TERM GOALS: Target date: 11/12/2022   SSolaewill shows a >/= 12 pt improvement in their ODI score (MCID is 12 pts) as a proxy for functional improvement    Evaluation/Baseline (09/17/2022): 56 pts (28/50) Goal status: 10/16/22 progressing 8 point improvement      2.  SMajestywill self report >/= 50% decrease in pain from evaluation    Evaluation/Baseline (09/17/2022): 8/10 max pain Goal status: 10/28/22 ongoing     3.  SCellinawill be able to lift 25 lbs from the floor and place on a 3 foot counter, not limited by pain    Evaluation/Baseline (09/17/2022): unable Goal status: ongoing     4.  SLashawnwill be able to stand for 60 min for work, not limited by pain    Evaluation/Baseline (09/17/2022): 10 min Goal status: 25 minutes 10/16/22 progressing       PLAN: PT FREQUENCY: 1-2x/week   PT DURATION: 8 weeks (Ending 11/12/2022)   PLANNED INTERVENTIONS: Therapeutic exercises, Aquatic therapy, Therapeutic activity, Neuro Muscular re-education, Gait training, Patient/Family education, Joint mobilization, Dry Needling, Electrical stimulation, Spinal mobilization and/or manipulation, Moist heat, Taping, Vasopneumatic device, Ionotophoresis 451mml Dexamethasone, and Manual therapy   PLAN FOR NEXT SESSION: progressive hip and core, nerve glides, ext based? Postural strength training. Training on gym equipment.   SuMarcelina MorelPT 10/28/2022, 5:17 PM

## 2022-10-29 ENCOUNTER — Encounter: Payer: Medicaid Other | Admitting: Physical Therapy

## 2022-11-04 ENCOUNTER — Ambulatory Visit: Payer: Medicaid Other | Admitting: Physical Therapy

## 2022-11-04 ENCOUNTER — Encounter: Payer: Self-pay | Admitting: Pediatrics

## 2022-11-05 ENCOUNTER — Other Ambulatory Visit: Payer: Self-pay | Admitting: Pediatrics

## 2022-11-05 DIAGNOSIS — E559 Vitamin D deficiency, unspecified: Secondary | ICD-10-CM

## 2022-11-05 DIAGNOSIS — R7303 Prediabetes: Secondary | ICD-10-CM

## 2022-11-05 DIAGNOSIS — E785 Hyperlipidemia, unspecified: Secondary | ICD-10-CM

## 2022-11-06 ENCOUNTER — Ambulatory Visit: Payer: Medicaid Other

## 2022-11-11 ENCOUNTER — Encounter: Payer: Self-pay | Admitting: Physical Therapy

## 2022-11-11 ENCOUNTER — Ambulatory Visit: Payer: Medicaid Other | Admitting: Physical Therapy

## 2022-11-11 DIAGNOSIS — M5459 Other low back pain: Secondary | ICD-10-CM

## 2022-11-11 DIAGNOSIS — M6281 Muscle weakness (generalized): Secondary | ICD-10-CM

## 2022-11-11 NOTE — Therapy (Signed)
OUTPATIENT PHYSICAL THERAPY TREATMENT NOTE   Patient Name: Kristen Sexton MRN: [id number] DOB:May 14, 2001, 22 y.o., female Today's Date: 11/11/2022  PCP: Hanvey, [place], MD   REFERRING PROVIDER: Hanvey, [place], MD  PT End of Session - 11/11/22 1918     Visit Number 6    Date for PT Re-Evaluation 12/09/22    PT Start Time 1720    PT Stop Time 1758    PT Time Calculation (min) 38 min    Activity Tolerance Patient tolerated treatment well    Behavior During Therapy [facility] for tasks assessed/performed               Past Medical History:  Diagnosis Date   Allergy    Environmental allergies    Pre-diabetes    Past Surgical History:  Procedure Laterality Date   NO PAST SURGERIES     Patient Active Problem List   Diagnosis Date Noted   BMI 30.0-30.9,adult 10/03/2022   Positive depression screening 10/03/2022   Wears glasses 10/03/2022   Acute bilateral low back pain with left-sided sciatica 10/03/2022   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   Psychosocial stressors 10/03/2022   Gas pain 10/03/2022   Prediabetes 05/21/2022   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   Acanthosis nigricans 05/21/2022   Hair loss 05/21/2022   BMI (body mass index), pediatric, 5% to less than 85% for age 16/17/2022   BMI 26.0-26.9,adult 01/30/2021   Dysmenorrhea 01/30/2021   Comedonal acne 01/30/2021   TMJ (temporomandibular joint disorder) 04/07/2019   Tinea versicolor 04/10/2016   Viral warts 04/04/2016   Allergic rhinitis 07/20/2015    THERAPY DIAG:  Other low back pain  Muscle weakness   Rationale for Evaluation and Treatment Rehabilitation  REFERRING DIAG: Other intervertebral disc displacement, 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 (Pt reports that this was confirmed with MRI)   PRECAUTIONS/RESTRICTIONS:   none  SUBJECTIVE:  Pt reports that she worked x 5 hours yesterday and could feel the sciatic pain in her L leg. She has not consistently performed her HEP, able to  perform the bed exercises more.  Pain: Are you having pain? Yes Pain location: L sided low back and L LE pain NPRS scale:  5/10 to 8/10 Aggravating factors: standing  > 15 minutes Relieving factors: sitting Pain description: constant, burning, and aching Stage: Chronic Stability: getting worse 24 hour pattern: better in morning  She currently works a job that she is standing >6 hours  OBJECTIVE: (objective measures completed at initial evaluation unless otherwise dated)  DIAGNOSTIC FINDINGS:  X-rays (-), pt states MRI shows ruptured disk L L5   SENSATION:          Light touch: Deficits diminished throughout L LE   MUSCLE LENGTH: Hamstrings: Right no restriction; Left no restriction ASLR: Right ASLR = PSLR; Left ASLR = PSLR Thomas test: Right no restriction; Left no restriction   LUMBAR AROM   AROM AROM  09/17/2022  Flexion Fingertips to toes (WNL), w/ concordant pain  Extension WNL, w/ no pain  Right lateral flexion WNL  Left lateral flexion WNL  Right rotation WNL  Left rotation WNL, w/ concordant pain    (Blank rows = not tested)     LUMBAR SPECIAL TESTS:  Straight leg raise: L (+), R (-) Slump: L (+), R (-)   LE MMT:   MMT Right 09/17/2022 Left 09/17/2022  Hip flexion (L2, L3) 4 4*  Knee extension (L3) 4+ 3+*  Knee flexion 4 4  Hip abduction 3  non concordant pain 3 non concordant pain  Hip extension      Hip external rotation      Hip internal rotation      Hip adduction      Ankle dorsiflexion (L4) c c*  Ankle plantarflexion (S1) c c  Ankle inversion      Ankle eversion      Great Toe ext (L5) c c  Grossly        (Blank rows = not tested, score listed is out of 5 possible points.  N = WNL, D = diminished, C = clear for gross weakness with myotome testing, * = concordant pain with testing)     LE ROM:   ROM Right 09/17/2022 Left 09/17/2022  Hip flexion      Hip extension      Hip abduction      Hip adduction      Hip internal rotation      Hip  external rotation      Knee flexion      Knee extension      Ankle dorsiflexion      Ankle plantarflexion      Ankle inversion      Ankle eversion         (Blank rows = not tested, N = WNL, * = concordant pain with testing)   Functional Tests   Eval (09/17/2022)                                                                                                              PATIENT SURVEYS:  Modified Oswestry 28/50     10/16/22 20/56 improved from eval     PATIENT EDUCATION:  POC, diagnosis, prognosis, HEP, and outcome measures.  Pt educated via explanation, demonstration, and handout (HEP).  Pt confirms understanding verbally.    HOME EXERCISE PROGRAM: Access Code: [email or web address] URL: [email or web address] Date: 10/16/2022 Prepared by: Lum Babe  Exercises - Supine Piriformis Stretch Pulling Heel to Hip  - 2 x daily - 7 x weekly - 1 sets - 5 reps - 30 hold   Access Code: [email or web address] URL: [email or web address] Date: 09/17/2022 Prepared by: Shearon Balo   Exercises - Supine Posterior Pelvic Tilt  - 2 x daily - 7 x weekly - 2 sets - 10 reps - 5'' hold - Seated Sciatic Tensioner  - 2 x daily - 7 x weekly - 15 reps - Prone Press Up On Elbows  - 2 x daily - 7 x weekly - 2 sets - 10 reps - Standing Lumbar Extension  - 2 x daily - 7 x weekly - 3 sets - 10 reps   ASTERISK SIGNS     Asterisk Signs Eval (09/17/2022) 10/16/22           Standing tolerance 10 min  25          pain 6-8/10  5-8          Forward flexion Causes pain  pain  TODAY'S TREATMENT  11/11/22 NuStep L5 x 6 minutes RE-assessed status for PN, see goals Assess hip flexor tightness, R very tight, performed thomas stretch with knee flex and relaxation. Updated HEP  10/28/22 NuStep L5 x 6 minutes Reviewed Neural glides. Patient reports they are very painful. Modified into seated HS stretch with ankle pumps for more gentle  mobilization. Supine pelvic tilt into bridge x 10 Supine clamshell against G tband x 10 Bridge with Green Tband abd resistance at knees x 5 Supine pelvic tilt, marching legs. Sit <> stand x 10, no UE support B side stepping against G tband resistance 4 x 5 reps Heel raises x 10  10/16/22 Bike level 4 x 4 minutes Posture and body mechanics instruction for patient care, transfer patient from chair to bed, rolling patient and patient from supine to sit Nustep level 5 x 4 minutes Seated row 20# 2x10 Lats 20# 2x10 Passive HS and piriformis stretch Discussion of anatomy and the disc issue and how the extension exercises will help [place] position    TREATMENT 1/17:  Therapeutic Exercise: - nu-step L5 40mwhile taking subjective and planning session with patient - LTR - 20x  - alternating clam in supine - blue TB - 3x10 - hip adduction squeeze - 5'' 2x10 - supine bridge - 3x10 - Sciatic nerve glide (L) - 2x20 - supine - Supine abdominal isometric squeeze with swiss ball - 2x10 - 5'' - prone press up  Manual Therapy: - CPA and UPA lower lumbar spine, pt in prone, G III-IV   ASSESSMENT:   CLINICAL IMPRESSION: Patient arrived 10 minutes late. She feels she has improved overall, but did note significant increase in pain after working x approximately 5 hours with a lot of standing-sciatic pain. Functional status was re-assessed for PN. She would benefit from continued PT in order to improve her trunk stability as well as flexibitlity to decrease her pain and discomfort performing her normal daily activities. OBJECTIVE IMPAIRMENTS: Pain, core and hip strength   ACTIVITY LIMITATIONS: standing, work, lifting, bending   PERSONAL FACTORS: See medical history and pertinent history     REHAB POTENTIAL: Good   CLINICAL DECISION MAKING: Stable/uncomplicated   EVALUATION COMPLEXITY: Low     GOALS:     SHORT TERM GOALS: Target date: 10/15/2022   SValliewill be >75% HEP compliant to  improve carryover between sessions and facilitate independent management of condition   Evaluation (09/17/2022): ongoing Goal status:met 11/11/22 met     LONG TERM GOALS: Target date: 11/12/2022   SArynnwill shows a >/= 12 pt improvement in their ODI score (MCID is 12 pts) as a proxy for functional improvement    Evaluation/Baseline (09/17/2022): 56 pts (28/50) Goal status: 10/16/22 progressing 8 point improvement     2.  SAnalishawill self report >/= 50% decrease in pain from evaluation    Evaluation/Baseline (09/17/2022): 8/10 max pain Goal status: 10/28/22 ongoing, 11/11/22-Improved until she worked yesterday, then up to 6/10 ongoing     3.  SCheresewill be able to lift 25 lbs from the floor and place on a 3 foot counter, not limited by pain    Evaluation/Baseline (09/17/2022): unable Goal status: 11/11/22-Unable, ongoing     4.  SAkimawill be able to stand for 60 min for work, not limited by pain    Evaluation/Baseline (09/17/2022): 10 min Goal status: 11/11/22 Worked x 5 hours, pain started around the 5th hour. met       PLAN: PT FREQUENCY: 1-2x/week  PT DURATION: 8 weeks (Ending 11/12/2022)   PLANNED INTERVENTIONS: Therapeutic exercises, Aquatic therapy, Therapeutic activity, Neuro Muscular re-education, Gait training, Patient/Family education, Joint mobilization, Dry Needling, Electrical stimulation, Spinal mobilization and/or manipulation, Moist heat, Taping, Vasopneumatic device, Ionotophoresis '4mg'$ /ml Dexamethasone, and Manual therapy   PLAN FOR NEXT SESSION: progressive hip and core, nerve glides, ext based? Postural strength training. Training on gym equipment.   Marcelina Morel DPT 11/11/2022, 7:22 PM

## 2022-11-18 ENCOUNTER — Ambulatory Visit: Payer: Medicaid Other | Admitting: Physical Therapy

## 2022-11-21 ENCOUNTER — Encounter: Payer: Medicaid Other | Attending: Pediatrics | Admitting: Dietician

## 2022-11-21 ENCOUNTER — Ambulatory Visit: Payer: Medicaid Other | Attending: Pediatrics | Admitting: Physical Therapy

## 2022-11-21 ENCOUNTER — Encounter: Payer: Self-pay | Admitting: Dietician

## 2022-11-21 ENCOUNTER — Encounter: Payer: Self-pay | Admitting: Physical Therapy

## 2022-11-21 VITALS — Ht 66.0 in | Wt 191.0 lb

## 2022-11-21 DIAGNOSIS — R7303 Prediabetes: Secondary | ICD-10-CM

## 2022-11-21 DIAGNOSIS — M5459 Other low back pain: Secondary | ICD-10-CM | POA: Insufficient documentation

## 2022-11-21 DIAGNOSIS — Z713 Dietary counseling and surveillance: Secondary | ICD-10-CM | POA: Diagnosis not present

## 2022-11-21 DIAGNOSIS — M6281 Muscle weakness (generalized): Secondary | ICD-10-CM | POA: Diagnosis present

## 2022-11-21 DIAGNOSIS — E559 Vitamin D deficiency, unspecified: Secondary | ICD-10-CM | POA: Insufficient documentation

## 2022-11-21 DIAGNOSIS — E785 Hyperlipidemia, unspecified: Secondary | ICD-10-CM | POA: Diagnosis not present

## 2022-11-21 NOTE — Therapy (Signed)
OUTPATIENT PHYSICAL THERAPY TREATMENT NOTE   Patient Name: Kristen Sexton MRN: [id number] DOB:April 13, 2001, 22 y.o., female Today's Date: 11/21/2022  PCP: Hanvey, [place], MD   REFERRING PROVIDER: Hanvey, [place], MD  PT End of Session - 11/21/22 1409     Visit Number 7    Date for PT Re-Evaluation 12/09/22    PT Start Time [id number]    PT Stop Time 1440    PT Time Calculation (min) 35 min    Activity Tolerance Patient tolerated treatment well    Behavior During Therapy [facility] for tasks assessed/performed                Past Medical History:  Diagnosis Date   Allergy    Environmental allergies    Pre-diabetes    Past Surgical History:  Procedure Laterality Date   NO PAST SURGERIES     Patient Active Problem List   Diagnosis Date Noted   BMI 30.0-30.9,adult 10/03/2022   Positive depression screening 10/03/2022   Wears glasses 10/03/2022   Acute bilateral low back pain with left-sided sciatica 10/03/2022   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   Psychosocial stressors 10/03/2022   Gas pain 10/03/2022   Prediabetes 05/21/2022   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   Acanthosis nigricans 05/21/2022   Hair loss 05/21/2022   BMI (body mass index), pediatric, 5% to less than 85% for age 48/17/2022   BMI 26.0-26.9,adult 01/30/2021   Dysmenorrhea 01/30/2021   Comedonal acne 01/30/2021   TMJ (temporomandibular joint disorder) 04/07/2019   Tinea versicolor 04/10/2016   Viral warts 04/04/2016   Allergic rhinitis 07/20/2015    THERAPY DIAG:  Other low back pain  Muscle weakness   Rationale for Evaluation and Treatment Rehabilitation  REFERRING DIAG: Other intervertebral disc displacement, 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 (Pt reports that this was confirmed with MRI)   PRECAUTIONS/RESTRICTIONS:   none  SUBJECTIVE:  Pt reports that she has started going to the gym. She tried running, but that hurt too much. Advised not to run at this time. She does not feel  the gym exercises are affecting her pain, but also states she does ont know when it will flare up.  Pain: Are you having pain? Yes Pain location: L sided low back and L LE pain NPRS scale:  5/10 to 8/10 Aggravating factors: standing  > 15 minutes Relieving factors: sitting Pain description: constant, burning, and aching Stage: Chronic Stability: getting worse 24 hour pattern: better in morning  She currently works a job that she is standing >6 hours  OBJECTIVE: (objective measures completed at initial evaluation unless otherwise dated)  DIAGNOSTIC FINDINGS:  X-rays (-), pt states MRI shows ruptured disk L L5   SENSATION:          Light touch: Deficits diminished throughout L LE   MUSCLE LENGTH: Hamstrings: Right no restriction; Left no restriction ASLR: Right ASLR = PSLR; Left ASLR = PSLR Thomas test: Right no restriction; Left no restriction   LUMBAR AROM   AROM AROM  09/17/2022  Flexion Fingertips to toes (WNL), w/ concordant pain  Extension WNL, w/ no pain  Right lateral flexion WNL  Left lateral flexion WNL  Right rotation WNL  Left rotation WNL, w/ concordant pain    (Blank rows = not tested)     LUMBAR SPECIAL TESTS:  Straight leg raise: L (+), R (-) Slump: L (+), R (-)   LE MMT:   MMT Right 09/17/2022 Left 09/17/2022  Hip flexion (L2, L3) 4  4*  Knee extension (L3) 4+ 3+*  Knee flexion 4 4  Hip abduction 3 non concordant pain 3 non concordant pain  Hip extension      Hip external rotation      Hip internal rotation      Hip adduction      Ankle dorsiflexion (L4) c c*  Ankle plantarflexion (S1) c c  Ankle inversion      Ankle eversion      Great Toe ext (L5) c c  Grossly        (Blank rows = not tested, score listed is out of 5 possible points.  N = WNL, D = diminished, C = clear for gross weakness with myotome testing, * = concordant pain with testing)     LE ROM:   ROM Right 09/17/2022 Left 09/17/2022  Hip flexion      Hip extension      Hip  abduction      Hip adduction      Hip internal rotation      Hip external rotation      Knee flexion      Knee extension      Ankle dorsiflexion      Ankle plantarflexion      Ankle inversion      Ankle eversion         (Blank rows = not tested, N = WNL, * = concordant pain with testing)   Functional Tests   Eval (09/17/2022)                                                                                                              PATIENT SURVEYS:  Modified Oswestry 28/50     10/16/22 20/56 improved from eval     PATIENT EDUCATION:  POC, diagnosis, prognosis, HEP, and outcome measures.  Pt educated via explanation, demonstration, and handout (HEP).  Pt confirms understanding verbally.    HOME EXERCISE PROGRAM: Access Code: [email or web address] URL: [email or web address] Date: 10/16/2022 Prepared by: Lum Babe  Exercises - Supine Piriformis Stretch Pulling Heel to Hip  - 2 x daily - 7 x weekly - 1 sets - 5 reps - 30 hold   Access Code: [email or web address] URL: [email or web address] Date: 09/17/2022 Prepared by: Shearon Balo   Exercises - Supine Posterior Pelvic Tilt  - 2 x daily - 7 x weekly - 2 sets - 10 reps - 5'' hold - Seated Sciatic Tensioner  - 2 x daily - 7 x weekly - 15 reps - Prone Press Up On Elbows  - 2 x daily - 7 x weekly - 2 sets - 10 reps - Standing Lumbar Extension  - 2 x daily - 7 x weekly - 3 sets - 10 reps   ASTERISK SIGNS     Asterisk Signs Eval (09/17/2022) 10/16/22           Standing tolerance 10 min  25          pain 6-8/10  5-8          Forward  flexion Causes pain  pain                                            TODAY'S TREATMENT  11/21/22 NuStep L4 x 6 minutes Sof ttissue assessment-TTP L gluts, piriformis, proximal ITB SI assessment- L inominate downward rotation- treated with ME, with improvement noted at re-test. Supine iso add/abd 10 x 5 sec Self massage with tennis ball to L gluts,  piriformis Educated to use of sacral belt to decrease mobility in SI joint while inflammation resolves.  11/11/22 NuStep L5 x 6 minutes RE-assessed status for PN, see goals Assess hip flexor tightness, R very tight, performed thomas stretch with knee flex and relaxation. Updated HEP  10/28/22 NuStep L5 x 6 minutes Reviewed Neural glides. Patient reports they are very painful. Modified into seated HS stretch with ankle pumps for more gentle mobilization. Supine pelvic tilt into bridge x 10 Supine clamshell against G tband x 10 Bridge with Green Tband abd resistance at knees x 5 Supine pelvic tilt, marching legs. Sit <> stand x 10, no UE support B side stepping against G tband resistance 4 x 5 reps Heel raises x 10  10/16/22 Bike level 4 x 4 minutes Posture and body mechanics instruction for patient care, transfer patient from chair to bed, rolling patient and patient from supine to sit Nustep level 5 x 4 minutes Seated row 20# 2x10 Lats 20# 2x10 Passive HS and piriformis stretch Discussion of anatomy and the disc issue and how the extension exercises will help [place] position    TREATMENT 1/17:  Therapeutic Exercise: - nu-step L5 44mwhile taking subjective and planning session with patient - LTR - 20x  - alternating clam in supine - blue TB - 3x10 - hip adduction squeeze - 5'' 2x10 - supine bridge - 3x10 - Sciatic nerve glide (L) - 2x20 - supine - Supine abdominal isometric squeeze with swiss ball - 2x10 - 5'' - prone press up  Manual Therapy: - CPA and UPA lower lumbar spine, pt in prone, G III-IV   ASSESSMENT:   CLINICAL IMPRESSION: Patient arrived 5 minutes late. Notes overall imporvement, but still a lot of spasm and pain in L hip muscles. L inominate noted in downward rotation. Provided ME techniques to correct the mal alignment, with improvement noted. She was educated to perofrm STM to L hip muscles, stretch, then strengthening, wear sacral belt. Perform core  strengthening in gym, but light weights and no SLS, hold on standing hip abd from HEP. Also advised she may need to ice her hip after her STM.   OBJECTIVE IMPAIRMENTS: Pain, core and hip strength   ACTIVITY LIMITATIONS: standing, work, lifting, bending   PERSONAL FACTORS: See medical history and pertinent history     REHAB POTENTIAL: Good   CLINICAL DECISION MAKING: Stable/uncomplicated   EVALUATION COMPLEXITY: Low     GOALS:     SHORT TERM GOALS: Target date: 10/15/2022   SIsraellawill be >75% HEP compliant to improve carryover between sessions and facilitate independent management of condition   Evaluation (09/17/2022): ongoing Goal status:met 11/11/22 met     LONG TERM GOALS: Target date: 11/12/2022   SRaevinwill shows a >/= 12 pt improvement in their ODI score (MCID is 12 pts) as a proxy for functional improvement    Evaluation/Baseline (09/17/2022): 56 pts (28/50) Goal status: 10/16/22 progressing 8 point improvement  2.  Lakaisha will self report >/= 50% decrease in pain from evaluation    Evaluation/Baseline (09/17/2022): 8/10 max pain Goal status: 10/28/22 ongoing, 11/11/22-Improved until she worked yesterday, then up to 6/10 ongoing     3.  Ruthelma will be able to lift 25 lbs from the floor and place on a 3 foot counter, not limited by pain    Evaluation/Baseline (09/17/2022): unable Goal status: 11/11/22-Unable, ongoing     4.  Arius will be able to stand for 60 min for work, not limited by pain    Evaluation/Baseline (09/17/2022): 10 min Goal status: 11/11/22 Worked x 5 hours, pain started around the 5th hour. Met, 11/21/22 Improved, but ongoing pain after standing.       PLAN: PT FREQUENCY: 1-2x/week   PT DURATION: 8 weeks (Ending 11/12/2022)   PLANNED INTERVENTIONS: Therapeutic exercises, Aquatic therapy, Therapeutic activity, Neuro Muscular re-education, Gait training, Patient/Family education, Joint mobilization, Dry Needling, Electrical stimulation, Spinal mobilization  and/or manipulation, Moist heat, Taping, Vasopneumatic device, Ionotophoresis '4mg'$ /ml Dexamethasone, and Manual therapy   PLAN FOR NEXT SESSION: progressive hip and core, nerve glides, ext based? Postural strength training. Training on gym equipment.   Marcelina Morel DPT 11/21/2022, 2:53 PM

## 2022-11-21 NOTE — Progress Notes (Signed)
Medical Nutrition Therapy  Appointment Start time:  [phone]  Appointment End time:  1700  Primary concerns today: Pt is most concerned about her prediabetes diagnosis.    Referral diagnosis: prediabetes Preferred learning style: no preference indicated Learning readiness: ready   NUTRITION ASSESSMENT   Anthropometrics  Ht: 66 in  Wt: 191 lbs  Clinical Medical Hx: allergies Medications: reviewed Labs: 10/01/22 vitamin D 8, HDL 46, A1c 6.4% Notable Signs/Symptoms: none reported Food Allergies: none reported  Lifestyle & Dietary Hx  Pt states she is concerned about her prediabetes diagnosis and wants to learn how she can better control her blood glucose.   Pt reports she just started a gym membership 1 week ago. She states she goes with her friends and they like to socialize but also workout so they spend about 2 hours there 3-4 days per week. Pt reports prior to this she did not exercise.   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 She states she got in a bad habit of eating out even when her mom would cook so now she is trying to eat at home more often.    Pt states she was previously drinking regular coke but switched to diet coke and now only has them a few times a week.   Pt states she sometimes eats 2 meals per day. Pt will be practicing Ramadan which starts this weekend and wants to ensure she will be getting adequate nutrients.   Estimated daily fluid intake: 24 oz Supplements: vitamin D Sleep: not assessed Stress / self-care: moderate to high stress Current average weekly physical activity: 2 hours at gym 3-4 times per week.   24-Hr Dietary Recall First Meal: avocado toast on whole wheat with eggs Snack: none Second Meal: quesadilla and tropical smoothie cafe strawberry OR firehouse subs [place] sub  Snack: none Third Meal: chicken with potato and vegetables  Snack: none OR occasional candy Beverages: 2-3 diet cokes a week, 24  oz water.     NUTRITION DIAGNOSIS  [place]-2.2 Altered nutrition-related laboratory As related to prediabetes.  As evidenced by A1c 6.4.   NUTRITION INTERVENTION  Nutrition education (E-1) on the following topics:  Insulin resistance Prediabetes and A1c Building balanced meals and snacks MyPlate Hydration needs and goals Impact of physical activity on blood glucose Complex carbohydrates Impact of each macronutrient on blood glucose  Handouts Provided Include  Dish Up a Healthy Meal Meal Ideas A1c chart Balanced Snacks  Learning Style & Readiness for Change Teaching method utilized: Visual & Auditory  Demonstrated degree of understanding via: Teach Back  Barriers to learning/adherence to lifestyle change: none  Goals Established by Pt  Goal: Exercise for 60 minutes 3x/wk.   Goal: Limit eating out to 2x/wk.   Goal: Drink 64 oz of water daily.  MONITORING & EVALUATION Dietary intake, weekly physical activity, and follow up in 2 months.  Next Steps  Patient is to call for questions.

## 2022-11-21 NOTE — Patient Instructions (Addendum)
Goal: Exercise for 60 minutes 3x/wk.   Goal: Limit eating out to 2x/wk.   Goal: Drink 64 oz of water daily.  Aim to make 1/2 of your plate vegetables at least 2x/day. At snacks include a protein and complex carbohydrate. At meals include a protein, carbohydrate, and vegetables.

## 2022-11-29 ENCOUNTER — Ambulatory Visit: Payer: Medicaid Other | Admitting: Physical Therapy

## 2022-12-04 ENCOUNTER — Ambulatory Visit: Payer: Medicaid Other | Admitting: Physical Therapy

## 2022-12-06 ENCOUNTER — Ambulatory Visit: Payer: Medicaid Other

## 2022-12-09 ENCOUNTER — Ambulatory Visit: Payer: Medicaid Other

## 2022-12-09 DIAGNOSIS — M6281 Muscle weakness (generalized): Secondary | ICD-10-CM

## 2022-12-09 DIAGNOSIS — M5459 Other low back pain: Secondary | ICD-10-CM

## 2022-12-09 NOTE — Therapy (Signed)
OUTPATIENT PHYSICAL THERAPY TREATMENT NOTE   Patient Name: Kristen Sexton MRN: [id number] DOB:[birth year], 22 y.o., female Today's Date: 12/09/2022  PCP: Hanvey, [place], MD   REFERRING PROVIDER: Hanvey, [place], MD  PT End of Session - 12/09/22 1531     Visit Number 8    Date for PT Re-Evaluation 01/13/23    PT Start Time [id number]    PT Stop Time 1615    PT Time Calculation (min) 45 min    Activity Tolerance Patient tolerated treatment well    Behavior During Therapy [facility] for tasks assessed/performed                 Past Medical History:  Diagnosis Date   Allergy    Environmental allergies    Pre-diabetes    Past Surgical History:  Procedure Laterality Date   NO PAST SURGERIES     Patient Active Problem List   Diagnosis Date Noted   BMI 30.0-30.9,adult 10/03/2022   Positive depression screening 10/03/2022   Wears glasses 10/03/2022   Acute bilateral low back pain with left-sided sciatica 10/03/2022   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   Psychosocial stressors 10/03/2022   Gas pain 10/03/2022   Prediabetes 05/21/2022   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 (Pt reports that this was confirmed with MRI)   PRECAUTIONS/RESTRICTIONS:   none  SUBJECTIVE:  I started going to the gym, but I think it flares up my pain. I have not been doing the exercises because I have been so busy.     Pain:  Are you having pain? Yes Pain location: L sided low back and L LE pain NPRS scale:  5/10  Aggravating factors: standing  > 15 minutes Relieving factors: sitting Pain description: constant, burning, and aching Stage: Chronic Stability: getting worse 24 hour pattern: better in morning  She currently works a job that she is standing >6 hours  OBJECTIVE: (objective measures completed at initial evaluation unless otherwise dated)  DIAGNOSTIC FINDINGS:  X-rays (-), pt states MRI shows ruptured disk L L5   SENSATION:          Light touch: Deficits diminished throughout L LE   MUSCLE LENGTH: Hamstrings: Right no restriction; Left no restriction ASLR: Right ASLR = PSLR; Left ASLR = PSLR Thomas test: Right no restriction; Left no restriction   LUMBAR AROM   AROM AROM  09/17/2022  Flexion Fingertips to toes (WNL), w/ concordant pain  Extension WNL, w/ no pain  Right lateral flexion WNL  Left lateral flexion WNL  Right rotation WNL  Left rotation WNL, w/ concordant pain    (Blank rows = not tested)     LUMBAR SPECIAL TESTS:  Straight leg raise: L (+), R (-) Slump: L (+), R (-)   LE MMT:   MMT Right 09/17/2022 Left 09/17/2022  Hip flexion (L2, L3) 4 4*  Knee extension (L3) 4+ 3+*  Knee flexion 4 4  Hip abduction 3 non concordant  pain 3 non concordant pain  Hip extension      Hip external rotation      Hip internal rotation      Hip adduction      Ankle dorsiflexion (L4) c c*  Ankle plantarflexion (S1) c c  Ankle inversion      Ankle eversion      Great Toe ext (L5) c c  Grossly        (Blank rows = not tested, score listed is out of 5 possible points.  N = WNL, D = diminished, C = clear for gross weakness with myotome testing, * = concordant pain with testing)     LE ROM:   ROM Right 09/17/2022 Left 09/17/2022  Hip flexion      Hip extension      Hip abduction      Hip adduction      Hip internal rotation      Hip external rotation      Knee flexion      Knee  extension      Ankle dorsiflexion      Ankle plantarflexion      Ankle inversion      Ankle eversion         (Blank rows = not tested, N = WNL, * = concordant pain with testing)   Functional Tests   Eval (09/17/2022)                                                                                                              PATIENT SURVEYS:  Modified Oswestry 28/50     10/16/22 20/56 improved from eval     PATIENT EDUCATION:  POC, diagnosis, prognosis, HEP, and outcome measures.  Pt educated via explanation, demonstration, and handout (HEP).  Pt confirms understanding verbally.    HOME EXERCISE PROGRAM: Access Code: [email or web address] URL: [email or web address] Date: 10/16/2022 Prepared by: Lum Babe  Exercises - Supine Piriformis Stretch Pulling Heel to Hip  - 2 x daily - 7 x weekly - 1 sets - 5 reps - 30 hold   Access Code: [email or web address] URL: [email or web address] Date: 09/17/2022 Prepared by: Shearon Balo   Exercises - Supine Posterior Pelvic Tilt  - 2 x daily - 7 x weekly - 2 sets - 10 reps - 5'' hold - Seated Sciatic Tensioner  - 2 x daily - 7 x weekly - 15 reps - Prone Press Up On Elbows  - 2 x daily - 7 x weekly - 2 sets - 10 reps - Standing Lumbar Extension  - 2 x daily - 7 x weekly - 3 sets - 10 reps   ASTERISK SIGNS     Asterisk Signs Eval (09/17/2022) 10/16/22           Standing tolerance 10 min  25          pain 6-8/10  5-8          Forward flexion Causes pain  pain  TODAY'S TREATMENT  12/09/22 NuStep L5 x39mins  Rows and Lats 25# 2x10  Deadbugs 2x10 AB roll ups 2x10 Prone cobra x10 5s holds HS and piriformis 30s stretch, hip flexor stretch off table 30s    11/21/22 NuStep L4 x 6 minutes Sof ttissue assessment-TTP L gluts, piriformis, proximal ITB SI assessment- L inominate downward rotation- treated with ME, with improvement noted at re-test. Supine iso add/abd 10 x 5  sec Self massage with tennis ball to L gluts, piriformis Educated to use of sacral belt to decrease mobility in SI joint while inflammation resolves.  11/11/22 NuStep L5 x 6 minutes RE-assessed status for PN, see goals Assess hip flexor tightness, R very tight, performed thomas stretch with knee flex and relaxation. Updated HEP  10/28/22 NuStep L5 x 6 minutes Reviewed Neural glides. Patient reports they are very painful. Modified into seated HS stretch with ankle pumps for more gentle mobilization. Supine pelvic tilt into bridge x 10 Supine clamshell against G tband x 10 Bridge with Green Tband abd resistance at knees x 5 Supine pelvic tilt, marching legs. Sit <> stand x 10, no UE support B side stepping against G tband resistance 4 x 5 reps Heel raises x 10  10/16/22 Bike level 4 x 4 minutes Posture and body mechanics instruction for patient care, transfer patient from chair to bed, rolling patient and patient from supine to sit Nustep level 5 x 4 minutes Seated row 20# 2x10 Lats 20# 2x10 Passive HS and piriformis stretch Discussion of anatomy and the disc issue and how the extension exercises will help [place] position    TREATMENT 1/17:  Therapeutic Exercise: - nu-step L5 2m while taking subjective and planning session with patient - LTR - 20x  - alternating clam in supine - blue TB - 3x10 - hip adduction squeeze - 5'' 2x10 - supine bridge - 3x10 - Sciatic nerve glide (L) - 2x20 - supine - Supine abdominal isometric squeeze with swiss ball - 2x10 - 5'' - prone press up  Manual Therapy: - CPA and UPA lower lumbar spine, pt in prone, G III-IV   ASSESSMENT:   CLINICAL IMPRESSION: Patient returns with some continued back pain. She has been going to the gym but is not sure whether or not she is making things worse. Was advised to avoid doing flexion based exercises. Does has leg length discrepancy longer on the L side, not fixed because was able to correct with MET. We  worked on some core and back exercises today. Does well without c/o of pain with any intervention. Recerted for another month.   OBJECTIVE IMPAIRMENTS: Pain, core and hip strength   ACTIVITY LIMITATIONS: standing, work, lifting, bending   PERSONAL FACTORS: See medical history and pertinent history     REHAB POTENTIAL: Good   CLINICAL DECISION MAKING: Stable/uncomplicated   EVALUATION COMPLEXITY: Low     GOALS:     SHORT TERM GOALS: Target date: 10/15/2022   Audrene will be >75% HEP compliant to improve carryover between sessions and facilitate independent management of condition   Evaluation (09/17/2022): ongoing Goal status:met 11/11/22 met     LONG TERM GOALS: Target date: 01/13/23   Vivi will shows a >/= 12 pt improvement in their ODI score (MCID is 12 pts) as a proxy for functional improvement    Evaluation/Baseline (09/17/2022): 56 pts (28/50) Goal status: 10/16/22 progressing 8 point improvement 12/09/22- 23/50 46%       2.  Jaleea will self report >/= 50% decrease in pain  from evaluation    Evaluation/Baseline (09/17/2022): 8/10 max pain Goal status: 10/28/22 ongoing, 11/11/22-Improved until she worked yesterday, then up to 6/10 ongoing     3.  Leniah will be able to lift 25 lbs from the floor and place on a 3 foot counter, not limited by pain    Evaluation/Baseline (09/17/2022): unable Goal status: 11/11/22-Unable, ongoing     4.  Sanaah will be able to stand for 60 min for work, not limited by pain    Evaluation/Baseline (09/17/2022): 10 min Goal status: 11/11/22 Worked x 5 hours, pain started around the 5th hour. Met, 11/21/22 Improved, but ongoing pain after standing.       PLAN: PT FREQUENCY: 1-2x/week   PT DURATION: 8 weeks   PLANNED INTERVENTIONS: Therapeutic exercises, Aquatic therapy, Therapeutic activity, Neuro Muscular re-education, Gait training, Patient/Family education, Joint mobilization, Dry Needling, Electrical stimulation, Spinal mobilization and/or  manipulation, Moist heat, Taping, Vasopneumatic device, Ionotophoresis 4mg /ml Dexamethasone, and Manual therapy   PLAN FOR NEXT SESSION: progressive hip and core, nerve glides, ext based? Postural strength training. Training on gym equipment.   Andris Baumann DPT 12/09/2022, 4:17 PM

## 2022-12-18 ENCOUNTER — Ambulatory Visit: Payer: Medicaid Other

## 2022-12-30 ENCOUNTER — Ambulatory Visit: Payer: Medicaid Other | Attending: Pediatrics

## 2022-12-30 ENCOUNTER — Encounter: Payer: Self-pay | Admitting: Pediatrics

## 2022-12-30 DIAGNOSIS — M6281 Muscle weakness (generalized): Secondary | ICD-10-CM | POA: Insufficient documentation

## 2022-12-30 DIAGNOSIS — M5459 Other low back pain: Secondary | ICD-10-CM | POA: Insufficient documentation

## 2022-12-30 NOTE — Therapy (Signed)
OUTPATIENT PHYSICAL THERAPY TREATMENT NOTE   Patient Name: Kristen Sexton MRN: [id number] DOB:2001/08/20, 22 y.o., female Today's Date: 12/30/2022  PCP: Hanvey, [place], MD   REFERRING PROVIDER: Hanvey, [place], MD  PT End of Session - 12/30/22 1449     Visit Number 9    Date for PT Re-Evaluation 01/13/23    PT Start Time 1449    PT Stop Time 1530    PT Time Calculation (min) 41 min    Activity Tolerance Patient tolerated treatment well    Behavior During Therapy [facility] for tasks assessed/performed                  Past Medical History:  Diagnosis Date   Allergy    Environmental allergies    Pre-diabetes    Past Surgical History:  Procedure Laterality Date   NO PAST SURGERIES     Patient Active Problem List   Diagnosis Date Noted   BMI 30.0-30.9,adult 10/03/2022   Positive depression screening 10/03/2022   Wears glasses 10/03/2022   Acute bilateral low back pain with left-sided sciatica 10/03/2022   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   Psychosocial stressors 10/03/2022   Gas pain 10/03/2022   Prediabetes 05/21/2022   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 (Pt reports that this was confirmed with MRI)   PRECAUTIONS/RESTRICTIONS:   none  SUBJECTIVE: The back pain has gotten worse especially at work.     Pain: Are you having pain? Yes Pain location: L sided low back and L LE  pain NPRS scale:  5/10  Aggravating factors: standing  > 15 minutes Relieving factors: sitting Pain description: constant, burning, and aching Stage: Chronic Stability: getting worse 24 hour pattern: better in morning  She currently works a job that she is standing >6 hours  OBJECTIVE: (objective measures completed at initial evaluation unless otherwise dated)  DIAGNOSTIC FINDINGS:  X-rays (-), pt states MRI shows ruptured disk L L5   SENSATION:          Light touch: Deficits diminished throughout L LE   MUSCLE LENGTH: Hamstrings: Right no restriction; Left no restriction ASLR: Right ASLR = PSLR; Left ASLR = PSLR Thomas test: Right no restriction; Left no restriction   LUMBAR AROM   AROM AROM  09/17/2022  Flexion Fingertips to toes (WNL), w/ concordant pain  Extension WNL, w/ no pain  Right lateral flexion WNL  Left lateral flexion WNL  Right rotation WNL  Left rotation WNL, w/ concordant pain    (Blank rows = not tested)     LUMBAR SPECIAL TESTS:  Straight leg raise: L (+), R (-) Slump: L (+), R (-)   LE MMT:   MMT Right 09/17/2022 Left 09/17/2022  Hip flexion (L2, L3) 4 4*  Knee extension (L3) 4+ 3+*  Knee flexion 4 4  Hip abduction 3 non concordant pain 3 non concordant pain  Hip extension      Hip external rotation  Hip internal rotation      Hip adduction      Ankle dorsiflexion (L4) c c*  Ankle plantarflexion (S1) c c  Ankle inversion      Ankle eversion      Great Toe ext (L5) c c  Grossly        (Blank rows = not tested, score listed is out of 5 possible points.  N = WNL, D = diminished, C = clear for gross weakness with myotome testing, * = concordant pain with testing)     LE ROM:   ROM Right 09/17/2022 Left 09/17/2022  Hip flexion      Hip extension      Hip abduction      Hip adduction      Hip internal rotation      Hip external rotation      Knee flexion      Knee extension      Ankle dorsiflexion      Ankle plantarflexion       Ankle inversion      Ankle eversion         (Blank rows = not tested, N = WNL, * = concordant pain with testing)   Functional Tests   Eval (09/17/2022)                                                                                                              PATIENT SURVEYS:  Modified Oswestry 28/50     10/16/22 20/56 improved from eval     PATIENT EDUCATION:  POC, diagnosis, prognosis, HEP, and outcome measures.  Pt educated via explanation, demonstration, and handout (HEP).  Pt confirms understanding verbally.    HOME EXERCISE PROGRAM: Access Code: [email or web address] URL: [email or web address] Date: 10/16/2022 Prepared by: Stacie Glaze  Exercises - Supine Piriformis Stretch Pulling Heel to Hip  - 2 x daily - 7 x weekly - 1 sets - 5 reps - 30 hold   Access Code: [email or web address] URL: [email or web address] Date: 09/17/2022 Prepared by: Alphonzo Severance   Exercises - Supine Posterior Pelvic Tilt  - 2 x daily - 7 x weekly - 2 sets - 10 reps - 5'' hold - Seated Sciatic Tensioner  - 2 x daily - 7 x weekly - 15 reps - Prone Press Up On Elbows  - 2 x daily - 7 x weekly - 2 sets - 10 reps - Standing Lumbar Extension  - 2 x daily - 7 x weekly - 3 sets - 10 reps   ASTERISK SIGNS     Asterisk Signs Eval (09/17/2022) 10/16/22           Standing tolerance 10 min  25          pain 6-8/10  5-8          Forward flexion Causes pain  pain  TODAY'S TREATMENT  12/30/22 NuStep L5 x71mins  Shoulder ext 10# 2x10 Cable rows 10# 2x10  AR press 15# 2x10  Rows and Lats 25# 2x10 Leg press 40# 2x10  STS   12/09/22 NuStep L5 x49mins  Rows and Lats 25# 2x10  Deadbugs 2x10 AB roll ups 2x10 Prone cobra x10 5s holds HS and piriformis 30s stretch, hip flexor stretch off table 30s    11/21/22 NuStep L4 x 6 minutes Sof ttissue assessment-TTP L gluts, piriformis, proximal ITB SI assessment- L inominate downward  rotation- treated with ME, with improvement noted at re-test. Supine iso add/abd 10 x 5 sec Self massage with tennis ball to L gluts, piriformis Educated to use of sacral belt to decrease mobility in SI joint while inflammation resolves.  11/11/22 NuStep L5 x 6 minutes RE-assessed status for PN, see goals Assess hip flexor tightness, R very tight, performed thomas stretch with knee flex and relaxation. Updated HEP  10/28/22 NuStep L5 x 6 minutes Reviewed Neural glides. Patient reports they are very painful. Modified into seated HS stretch with ankle pumps for more gentle mobilization. Supine pelvic tilt into bridge x 10 Supine clamshell against G tband x 10 Bridge with Green Tband abd resistance at knees x 5 Supine pelvic tilt, marching legs. Sit <> stand x 10, no UE support B side stepping against G tband resistance 4 x 5 reps Heel raises x 10  10/16/22 Bike level 4 x 4 minutes Posture and body mechanics instruction for patient care, transfer patient from chair to bed, rolling patient and patient from supine to sit Nustep level 5 x 4 minutes Seated row 20# 2x10 Lats 20# 2x10 Passive HS and piriformis stretch Discussion of anatomy and the disc issue and how the extension exercises will help [place] position    TREATMENT 1/17:  Therapeutic Exercise: - nu-step L5 23m while taking subjective and planning session with patient - LTR - 20x  - alternating clam in supine - blue TB - 3x10 - hip adduction squeeze - 5'' 2x10 - supine bridge - 3x10 - Sciatic nerve glide (L) - 2x20 - supine - Supine abdominal isometric squeeze with swiss ball - 2x10 - 5'' - prone press up  Manual Therapy: - CPA and UPA lower lumbar spine, pt in prone, G III-IV   ASSESSMENT:   CLINICAL IMPRESSION: Patient returns with some continued back pain. Her insurance only accepted 3 more visits due to a lack of progress. She reports her pain increases when she works and has to be on her feet all day. Does  well without c/o of pain with all interventions.   OBJECTIVE IMPAIRMENTS: Pain, core and hip strength   ACTIVITY LIMITATIONS: standing, work, lifting, bending   PERSONAL FACTORS: See medical history and pertinent history     REHAB POTENTIAL: Good   CLINICAL DECISION MAKING: Stable/uncomplicated   EVALUATION COMPLEXITY: Low     GOALS:     SHORT TERM GOALS: Target date: 10/15/2022   Kiva will be >75% HEP compliant to improve carryover between sessions and facilitate independent management of condition   Evaluation (09/17/2022): ongoing Goal status:met 11/11/22 met     LONG TERM GOALS: Target date: 01/13/23   Judieth will shows a >/= 12 pt improvement in their ODI score (MCID is 12 pts) as a proxy for functional improvement    Evaluation/Baseline (09/17/2022): 56 pts (28/50) Goal status: 10/16/22 progressing 8 point improvement 12/09/22- 23/50 46%     2.  Avaleen will self report >/= 50% decrease in  pain from evaluation    Evaluation/Baseline (09/17/2022): 8/10 max pain Goal status: 10/28/22 ongoing, 11/11/22-Improved until she worked yesterday, then up to 6/10 ongoing     3.  Amenda will be able to lift 25 lbs from the floor and place on a 3 foot counter, not limited by pain    Evaluation/Baseline (09/17/2022): unable Goal status: 11/11/22-Unable, ongoing     4.  Emalina will be able to stand for 60 min for work, not limited by pain    Evaluation/Baseline (09/17/2022): 10 min Goal status: 11/11/22 Worked x 5 hours, pain started around the 5th hour. Met, 11/21/22 Improved, but ongoing pain after standing.       PLAN: PT FREQUENCY: 1-2x/week   PT DURATION: 8 weeks   PLANNED INTERVENTIONS: Therapeutic exercises, Aquatic therapy, Therapeutic activity, Neuro Muscular re-education, Gait training, Patient/Family education, Joint mobilization, Dry Needling, Electrical stimulation, Spinal mobilization and/or manipulation, Moist heat, Taping, Vasopneumatic device, Ionotophoresis 4mg /ml  Dexamethasone, and Manual therapy   PLAN FOR NEXT SESSION: progressive hip and core, nerve glides, ext based. Postural strength training. Training on gym equipment.   Lucretia Pendley DPT 12/30/2022, 3:29 PM

## 2023-01-01 NOTE — Telephone Encounter (Signed)
Patient dropped off form and I placed in forms folder

## 2023-01-04 ENCOUNTER — Other Ambulatory Visit: Payer: Self-pay | Admitting: Pediatrics

## 2023-01-04 DIAGNOSIS — B360 Pityriasis versicolor: Secondary | ICD-10-CM

## 2023-01-06 ENCOUNTER — Ambulatory Visit: Payer: Medicaid Other

## 2023-01-07 ENCOUNTER — Telehealth: Payer: Self-pay | Admitting: Pediatrics

## 2023-01-07 ENCOUNTER — Other Ambulatory Visit: Payer: Self-pay | Admitting: Pediatrics

## 2023-01-07 DIAGNOSIS — E559 Vitamin D deficiency, unspecified: Secondary | ICD-10-CM

## 2023-01-07 MED ORDER — KETOCONAZOLE 2 % EX SHAM: 1.0000 | MEDICATED_SHAMPOO | CUTANEOUS | 0 refills | Status: DC

## 2023-01-07 NOTE — Telephone Encounter (Signed)
Good morning,  Pt. called in and wants to know if she needs to fast for her upcoming labs test tomorrow. She also said she had a few other questions. Please reach out to the patient [phone].  Thank You!

## 2023-01-07 NOTE — Telephone Encounter (Signed)
Patient called in wanting to know if she needs to fast before lab appointment. Please call patient back at [phone] . Thank you.

## 2023-01-07 NOTE — Telephone Encounter (Signed)
Called and let patient know she does not need to fast prior to her lab appointment on 4/25. Also made her a 30 minute follow up appointment with Dr. Florestine Avers for 5/3.

## 2023-01-09 ENCOUNTER — Other Ambulatory Visit: Payer: Medicaid Other

## 2023-01-09 DIAGNOSIS — E559 Vitamin D deficiency, unspecified: Secondary | ICD-10-CM | POA: Diagnosis not present

## 2023-01-10 ENCOUNTER — Ambulatory Visit: Payer: Medicaid Other | Admitting: Physical Therapy

## 2023-01-10 DIAGNOSIS — M6281 Muscle weakness (generalized): Secondary | ICD-10-CM

## 2023-01-10 DIAGNOSIS — M5459 Other low back pain: Secondary | ICD-10-CM

## 2023-01-10 LAB — VITAMIN D 25 HYDROXY (VIT D DEFICIENCY, FRACTURES): Vit D, 25-Hydroxy: 34 ng/mL (ref 30–100)

## 2023-01-10 NOTE — Therapy (Signed)
OUTPATIENT PHYSICAL THERAPY TREATMENT NOTE   Patient Name: Kristen Sexton MRN: [id number] DOB:Oct 22, 2000, 22 y.o., female Today's Date: 01/10/2023  PCP: Hanvey, [place], MD   REFERRING PROVIDER: Hanvey, [place], MD  PT End of Session - 01/10/23 1023     Visit Number 10    Date for PT Re-Evaluation 01/13/23    Authorization Type [employer] MCD - ODI    PT Start Time 1021    PT Stop Time 1100    PT Time Calculation (min) 39 min                  Past Medical History:  Diagnosis Date   Allergy    Environmental allergies    Pre-diabetes    Past Surgical History:  Procedure Laterality Date   NO PAST SURGERIES     Patient Active Problem List   Diagnosis Date Noted   BMI 30.0-30.9,adult 10/03/2022   Positive depression screening 10/03/2022   Wears glasses 10/03/2022   Acute bilateral low back pain with left-sided sciatica 10/03/2022   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   Psychosocial stressors 10/03/2022   Gas pain 10/03/2022   Prediabetes 05/21/2022   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   Acanthosis nigricans 05/21/2022   Hair loss 05/21/2022   BMI (body mass index), pediatric, 5% to less than 85% for age 29/17/2022   BMI 26.0-26.9,adult 01/30/2021   Dysmenorrhea 01/30/2021   Comedonal acne 01/30/2021   TMJ (temporomandibular joint disorder) 04/07/2019   Tinea versicolor 04/10/2016   Viral warts 04/04/2016   Allergic rhinitis 07/20/2015    THERAPY DIAG:  Other low back pain  Muscle weakness   Rationale for Evaluation and Treatment Rehabilitation  REFERRING DIAG: Other intervertebral disc displacement, 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 (Pt reports that this was confirmed with MRI)   PRECAUTIONS/RESTRICTIONS:   none  SUBJECTIVE: The back pain has gotten worse especially at work and only working working 6 hours a day. Pain not bad in morning its just with standing    Pain: Are you having pain? NO OBJECTIVE: (objective measures completed at  initial evaluation unless otherwise dated)  DIAGNOSTIC FINDINGS:  X-rays (-), pt states MRI shows ruptured disk L L5   SENSATION:          Light touch: Deficits diminished throughout L LE   MUSCLE LENGTH: Hamstrings: Right no restriction; Left no restriction ASLR: Right ASLR = PSLR; Left ASLR = PSLR Thomas test: Right no restriction; Left no restriction   LUMBAR AROM   AROM AROM  09/17/2022  Flexion Fingertips to toes (WNL), w/ concordant pain  Extension WNL, w/ no pain  Right lateral flexion WNL  Left lateral flexion WNL  Right rotation WNL  Left rotation WNL, w/ concordant pain    (Blank rows = not tested)     LUMBAR SPECIAL TESTS:  Straight leg raise: L (+), R (-) Slump: L (+), R (-)   LE MMT:   MMT Right 09/17/2022 Left 09/17/2022  Hip flexion (L2, L3) 4 4*  Knee extension (L3) 4+ 3+*  Knee flexion 4 4  Hip abduction 3 non concordant pain 3 non concordant pain  Hip extension      Hip external rotation      Hip internal rotation      Hip adduction      Ankle dorsiflexion (L4) c c*  Ankle plantarflexion (S1) c c  Ankle inversion      Ankle eversion      Great Toe ext (L5) c  c  Grossly        (Blank rows = not tested, score listed is out of 5 possible points.  N = WNL, D = diminished, C = clear for gross weakness with myotome testing, * = concordant pain with testing)     LE ROM:   ROM Right 09/17/2022 Left 09/17/2022  Hip flexion      Hip extension      Hip abduction      Hip adduction      Hip internal rotation      Hip external rotation      Knee flexion      Knee extension      Ankle dorsiflexion      Ankle plantarflexion      Ankle inversion      Ankle eversion         (Blank rows = not tested, N = WNL, * = concordant pain with testing)   Functional Tests   Eval (09/17/2022)                                                                                                              PATIENT SURVEYS:  Modified Oswestry 28/50      10/16/22 20/56 improved from eval     PATIENT EDUCATION:  POC, diagnosis, prognosis, HEP, and outcome measures.  Pt educated via explanation, demonstration, and handout (HEP).  Pt confirms understanding verbally.    HOME EXERCISE PROGRAM: Access Code: [email or web address] URL: [email or web address] Date: 10/16/2022 Prepared by: Stacie Glaze  Exercises - Supine Piriformis Stretch Pulling Heel to Hip  - 2 x daily - 7 x weekly - 1 sets - 5 reps - 30 hold   Access Code: [email or web address] URL: [email or web address] Date: 09/17/2022 Prepared by: Alphonzo Severance   Exercises - Supine Posterior Pelvic Tilt  - 2 x daily - 7 x weekly - 2 sets - 10 reps - 5'' hold - Seated Sciatic Tensioner  - 2 x daily - 7 x weekly - 15 reps - Prone Press Up On Elbows  - 2 x daily - 7 x weekly - 2 sets - 10 reps - Standing Lumbar Extension  - 2 x daily - 7 x weekly - 3 sets - 10 reps   ASTERISK SIGNS     Asterisk Signs Eval (09/17/2022) 10/16/22           Standing tolerance 10 min  25          pain 6-8/10  5-8          Forward flexion Causes pain  pain                                            TODAY'S TREATMENT   01/10/23 Nustep  L 5  Lat Pull and Seated Row 2 sets 15 Standing red tband hip ext and abd 15 x BIL 10# cable shld ext 2 sets 10 Cable  pulley 15# AR press 15 x each side Core Stab 10 min supine    12/30/22 NuStep L5 x44mins  Shoulder ext 10# 2x10 Cable rows 10# 2x10  AR press 15# 2x10  Rows and Lats 25# 2x10 Leg press 40# 2x10  STS   12/09/22 NuStep L5 x26mins  Rows and Lats 25# 2x10  Deadbugs 2x10 AB roll ups 2x10 Prone cobra x10 5s holds HS and piriformis 30s stretch, hip flexor stretch off table 30s    11/21/22 NuStep L4 x 6 minutes Sof ttissue assessment-TTP L gluts, piriformis, proximal ITB SI assessment- L inominate downward rotation- treated with ME, with improvement noted at re-test. Supine iso add/abd 10 x 5 sec Self massage with tennis ball to  L gluts, piriformis Educated to use of sacral belt to decrease mobility in SI joint while inflammation resolves.  11/11/22 NuStep L5 x 6 minutes RE-assessed status for PN, see goals Assess hip flexor tightness, R very tight, performed thomas stretch with knee flex and relaxation. Updated HEP  10/28/22 NuStep L5 x 6 minutes Reviewed Neural glides. Patient reports they are very painful. Modified into seated HS stretch with ankle pumps for more gentle mobilization. Supine pelvic tilt into bridge x 10 Supine clamshell against G tband x 10 Bridge with Green Tband abd resistance at knees x 5 Supine pelvic tilt, marching legs. Sit <> stand x 10, no UE support B side stepping against G tband resistance 4 x 5 reps Heel raises x 10  10/16/22 Bike level 4 x 4 minutes Posture and body mechanics instruction for patient care, transfer patient from chair to bed, rolling patient and patient from supine to sit Nustep level 5 x 4 minutes Seated row 20# 2x10 Lats 20# 2x10 Passive HS and piriformis stretch Discussion of anatomy and the disc issue and how the extension exercises will help [place] position    TREATMENT 1/17:  Therapeutic Exercise: - nu-step L5 8m while taking subjective and planning session with patient - LTR - 20x  - alternating clam in supine - blue TB - 3x10 - hip adduction squeeze - 5'' 2x10 - supine bridge - 3x10 - Sciatic nerve glide (L) - 2x20 - supine - Supine abdominal isometric squeeze with swiss ball - 2x10 - 5'' - prone press up  Manual Therapy: - CPA and UPA lower lumbar spine, pt in prone, G III-IV   ASSESSMENT:   CLINICAL IMPRESSION: Patient returns with some continued back pain. Her insurance only accepted 3 more visits due to a lack of progress and Monday is last approved day but pt says she may appeal. Rec pt f/u with neuro MD first before she appeals as therapy does not seem to be helping and she needs to explore other options and she agreed. Pt has made  no goal progress. Pt had no issues with interventions today and states pain is better in morning and really only an issue with prolonged standing.  OBJECTIVE IMPAIRMENTS: Pain, core and hip strength   ACTIVITY LIMITATIONS: standing, work, lifting, bending   PERSONAL FACTORS: See medical history and pertinent history     REHAB POTENTIAL: Good   CLINICAL DECISION MAKING: Stable/uncomplicated   EVALUATION COMPLEXITY: Low     GOALS:     SHORT TERM GOALS: Target date: 10/15/2022   Alaisa will be >75% HEP compliant to improve carryover between sessions and facilitate independent management of condition   Evaluation (09/17/2022): ongoing Goal status:met 11/11/22 met     LONG TERM GOALS: Target date: 01/13/23  Sheralyn will shows a >/= 12 pt improvement in their ODI score (MCID is 12 pts) as a proxy for functional improvement    Evaluation/Baseline (09/17/2022): 56 pts (28/50) Goal status: 10/16/22 progressing 8 point improvement 12/09/22- 23/50 46%     2.  Shariyah will self report >/= 50% decrease in pain from evaluation    Evaluation/Baseline (09/17/2022): 8/10 max pain Goal status: 10/28/22 ongoing, 11/11/22-Improved until she worked yesterday, then up to 6/10 ongoing     3.  Paloma will be able to lift 25 lbs from the floor and place on a 3 foot counter, not limited by pain    Evaluation/Baseline (09/17/2022): unable Goal status: 11/11/22-Unable, ongoing     4.  Aybree will be able to stand for 60 min for work, not limited by pain    Evaluation/Baseline (09/17/2022): 10 min Goal status: 11/11/22 Worked x 5 hours, pain started around the 5th hour. Met, 11/21/22 Improved, but ongoing pain after standing.       PLAN: PT FREQUENCY: 1-2x/week   PT DURATION: 8 weeks   PLANNED INTERVENTIONS: Therapeutic exercises, Aquatic therapy, Therapeutic activity, Neuro Muscular re-education, Gait training, Patient/Family education, Joint mobilization, Dry Needling, Electrical stimulation, Spinal  mobilization and/or manipulation, Moist heat, Taping, Vasopneumatic device, Ionotophoresis 4mg /ml Dexamethasone, and Manual therapy   PLAN FOR NEXT SESSION: educ on HEPs and D/C . Rec MD follow up to explore options for pain and no changes and if needs therapy later have MD send back  Marzella Schlein Jazlin Tapscott PTA 01/10/2023, 10:24 [facility] at [facility] [address] [facility]. [place], [place], [address] Phone: [phone]   Fax:  [phone]  Patient Details  Name: Aliviya Schoeller MRN: [id number] Date of Birth: [birth year] Referring Provider:  Hanvey, [place], MD  Encounter Date: 01/10/2023

## 2023-01-13 ENCOUNTER — Ambulatory Visit: Payer: Medicaid Other

## 2023-01-13 DIAGNOSIS — M5459 Other low back pain: Secondary | ICD-10-CM | POA: Diagnosis not present

## 2023-01-13 DIAGNOSIS — M6281 Muscle weakness (generalized): Secondary | ICD-10-CM

## 2023-01-13 NOTE — Therapy (Signed)
OUTPATIENT PHYSICAL THERAPY DISCHARGE SUMMARY  Progress Note Reporting Period 09/17/22 to 01/13/23  See note below for Objective Data and Assessment of Progress/Goals.     Patient Name: Kristen Sexton MRN: [id number] DOB:07-29-2001, 22 y.o., female Today's Date: 01/13/2023  PCP: Hanvey, [place], MD   REFERRING PROVIDER: Hanvey, [place], MD  PT End of Session - 01/13/23 1731     Visit Number 11    Date for PT Re-Evaluation 01/13/23    PT Start Time 1440    PT Stop Time 1515    PT Time Calculation (min) 35 min    Activity Tolerance Patient tolerated treatment well    Behavior During Therapy [facility] for tasks assessed/performed                   Past Medical History:  Diagnosis Date   Allergy    Environmental allergies    Pre-diabetes    Past Surgical History:  Procedure Laterality Date   NO PAST SURGERIES     Patient Active Problem List   Diagnosis Date Noted   BMI 30.0-30.9,adult 10/03/2022   Positive depression screening 10/03/2022   Wears glasses 10/03/2022   Acute bilateral low back pain with left-sided sciatica 10/03/2022   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   Psychosocial stressors 10/03/2022   Gas pain 10/03/2022   Prediabetes 05/21/2022   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   Acanthosis nigricans 05/21/2022   Hair loss 05/21/2022   BMI (body mass index), pediatric, 5% to less than 85% for age 27/17/2022   BMI 26.0-26.9,adult 01/30/2021   Dysmenorrhea 01/30/2021   Comedonal acne 01/30/2021   TMJ (temporomandibular joint disorder) 04/07/2019   Tinea versicolor 04/10/2016   Viral warts 04/04/2016   Allergic rhinitis 07/20/2015    THERAPY DIAG:  Other low back pain  Muscle weakness   Rationale for Evaluation and Treatment Rehabilitation  REFERRING DIAG: Other intervertebral disc displacement, 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 (Pt reports that this was confirmed with MRI)   PRECAUTIONS/RESTRICTIONS:   none  SUBJECTIVE: The back  pain has gotten worse especially at work and only working working 6 hours a day. Pain not bad in morning its just with standing    Pain: Are you having pain? YES, L post thigh, extending to foot, provoked with neural stretch, such as prolonged standing, prolonged walking OBJECTIVE: (objective measures completed at initial evaluation unless otherwise dated)  DIAGNOSTIC FINDINGS:  X-rays (-), pt states MRI shows ruptured disk L L5   SENSATION:          Light touch: Deficits diminished throughout L LE   MUSCLE LENGTH: Hamstrings: Right no restriction; Left no restriction ASLR: Right ASLR = PSLR; Left ASLR = PSLR Thomas test: Right no restriction; Left no restriction   LUMBAR AROM   AROM AROM  09/17/2022 01/13/23  Flexion Fingertips to toes (WNL), w/ concordant pain Wnl L post thigh pain  Extension WNL, w/ no pain Wnl, excessive  Right lateral flexion WNL wnl  Left lateral flexion WNL wnl  Right rotation WNL   Left rotation WNL, w/ concordant pain     (Blank rows = not tested)     LUMBAR SPECIAL TESTS:  Straight leg raise: L (+), R (-) Slump: L (+), R (-)   LE MMT:   MMT Right 09/17/2022 Left 09/17/2022  Hip flexion (L2, L3) 4 4*  Knee extension (L3) 4+ 3+*  Knee flexion 4 4  Hip abduction 3 non concordant pain 3 non concordant pain  Hip  extension      Hip external rotation      Hip internal rotation      Hip adduction      Ankle dorsiflexion (L4) c c*  Ankle plantarflexion (S1) c c  Ankle inversion      Ankle eversion      Great Toe ext (L5) c c  Grossly        (Blank rows = not tested, score listed is out of 5 possible points.  N = WNL, D = diminished, C = clear for gross weakness with myotome testing, * = concordant pain with testing)     LE ROM:   ROM Right 09/17/2022 Left 09/17/2022  Hip flexion      Hip extension      Hip abduction      Hip adduction      Hip internal rotation      Hip external rotation      Knee flexion      Knee extension      Ankle  dorsiflexion      Ankle plantarflexion      Ankle inversion      Ankle eversion         (Blank rows = not tested, N = WNL, * = concordant pain with testing)   Functional Tests   Eval (09/17/2022)                                                                                                              PATIENT SURVEYS:  Modified Oswestry 28/50      10/16/22 20/56 improved from eval   01/13/23:  21/50 today   PATIENT EDUCATION:  POC, diagnosis, prognosis, HEP, and outcome measures.  Pt educated via explanation, demonstration, and handout (HEP).  Pt confirms understanding verbally.    HOME EXERCISE PROGRAM: Access Code: [email or web address] URL: [email or web address] Date: 10/16/2022 Prepared by: Stacie Glaze  Exercises - Supine Piriformis Stretch Pulling Heel to Hip  - 2 x daily - 7 x weekly - 1 sets - 5 reps - 30 hold   Access Code: [email or web address] URL: [email or web address] Date: 09/17/2022 Prepared by: Alphonzo Severance   Exercises - Supine Posterior Pelvic Tilt  - 2 x daily - 7 x weekly - 2 sets - 10 reps - 5'' hold - Seated Sciatic Tensioner  - 2 x daily - 7 x weekly - 15 reps - Prone Press Up On Elbows  - 2 x daily - 7 x weekly - 2 sets - 10 reps - Standing Lumbar Extension  - 2 x daily - 7 x weekly - 3 sets - 10 reps   ASTERISK SIGNS     Asterisk Signs Eval (09/17/2022) 10/16/22           Standing tolerance 10 min  25          pain 6-8/10  5-8          Forward flexion Causes pain  pain  TODAY'S TREATMENT  01/13/23: Therapeutic exercise: Reviewed patient's list of printed exercises, added and corrected the following: Prone alt arm and leg lifts,  Prone planks, from knees and forearms., with mirror to address patient's body awareness Prone props, Bridging Reviewed her gym program, recommended avoid B leg press and the abdominal machines to avoid risk of further back injury L hamstring  stretch with ankle pumps,cued to maintain strength between 2 and 4 /10  01/10/23 Nustep  L 5  Lat Pull and Seated Row 2 sets 15 Standing red tband hip ext and abd 15 x BIL 10# cable shld ext 2 sets 10 Cable pulley 15# AR press 15 x each side Core Stab 10 min supine    12/30/22 NuStep L5 x71mins  Shoulder ext 10# 2x10 Cable rows 10# 2x10  AR press 15# 2x10  Rows and Lats 25# 2x10 Leg press 40# 2x10  STS   12/09/22 NuStep L5 x47mins  Rows and Lats 25# 2x10  Deadbugs 2x10 AB roll ups 2x10 Prone cobra x10 5s holds HS and piriformis 30s stretch, hip flexor stretch off table 30s    11/21/22 NuStep L4 x 6 minutes Sof ttissue assessment-TTP L gluts, piriformis, proximal ITB SI assessment- L inominate downward rotation- treated with ME, with improvement noted at re-test. Supine iso add/abd 10 x 5 sec Self massage with tennis ball to L gluts, piriformis Educated to use of sacral belt to decrease mobility in SI joint while inflammation resolves.  11/11/22 NuStep L5 x 6 minutes RE-assessed status for PN, see goals Assess hip flexor tightness, R very tight, performed thomas stretch with knee flex and relaxation. Updated HEP  10/28/22 NuStep L5 x 6 minutes Reviewed Neural glides. Patient reports they are very painful. Modified into seated HS stretch with ankle pumps for more gentle mobilization. Supine pelvic tilt into bridge x 10 Supine clamshell against G tband x 10 Bridge with Green Tband abd resistance at knees x 5 Supine pelvic tilt, marching legs. Sit <> stand x 10, no UE support B side stepping against G tband resistance 4 x 5 reps Heel raises x 10  10/16/22 Bike level 4 x 4 minutes Posture and body mechanics instruction for patient care, transfer patient from chair to bed, rolling patient and patient from supine to sit Nustep level 5 x 4 minutes Seated row 20# 2x10 Lats 20# 2x10 Passive HS and piriformis stretch Discussion of anatomy and the disc issue and how the  extension exercises will help [place] position    TREATMENT 1/17:  Therapeutic Exercise: - nu-step L5 72m while taking subjective and planning session with patient - LTR - 20x  - alternating clam in supine - blue TB - 3x10 - hip adduction squeeze - 5'' 2x10 - supine bridge - 3x10 - Sciatic nerve glide (L) - 2x20 - supine - Supine abdominal isometric squeeze with swiss ball - 2x10 - 5'' - prone press up  Manual Therapy: - CPA and UPA lower lumbar spine, pt in prone, G III-IV   ASSESSMENT:   CLINICAL IMPRESSION: Patient returns with some continued back pain.still has + slump test L and + SLR, provocation of referred pain with standing lumbar forward bending.   Noted some difficulty with proprioception, awareness of neutral spine posture, tried to address with her exercise program which she brought in today. Will dc today as planned. Pt to return to neurologist soon.   OBJECTIVE IMPAIRMENTS: Pain, core and hip strength   ACTIVITY LIMITATIONS: standing, work, lifting, bending   PERSONAL  FACTORS: See medical history and pertinent history     REHAB POTENTIAL: Good   CLINICAL DECISION MAKING: Stable/uncomplicated   EVALUATION COMPLEXITY: Low     GOALS:     SHORT TERM GOALS: Target date: 10/15/2022   Christabell will be >75% HEP compliant to improve carryover between sessions and facilitate independent management of condition   Evaluation (09/17/2022): ongoing Goal status:met 11/11/22 met     LONG TERM GOALS: Target date: 01/13/23   Naija will shows a >/= 12 pt improvement in their ODI score (MCID is 12 pts) as a proxy for functional improvement    Evaluation/Baseline (09/17/2022): 56 pts (28/50) Goal status: 10/16/22 progressing 8 point improvement 12/09/22- 23/50 46%  01/13/23 21/50 7 pt improvement overall     2.  Mckenna will self report >/= 50% decrease in pain from evaluation    Evaluation/Baseline (09/17/2022): 8/10 max pain Goal status: 10/28/22 ongoing, 11/11/22-Improved  until she worked yesterday, then up to 6/10 ongoing 01/13/23: partially met,      3.  Kindred will be able to lift 25 lbs from the floor and place on a 3 foot counter, not limited by pain    Evaluation/Baseline (09/17/2022): unable Goal status: 11/11/22-Unable, ongoing 01/13/23: unable yet     4.  Laverna will be able to stand for 60 min for work, not limited by pain    Evaluation/Baseline (09/17/2022): 10 min Goal status: 11/11/22 Worked x 5 hours, pain started around the 5th hour. Met, 11/21/22 Improved, but ongoing pain after standing. 01/13/23:  stil painful with prolonged standing and walking       PLAN: PT FREQUENCY: 1-2x/week   PT DURATION: 8 weeks   PLANNED INTERVENTIONS: Therapeutic exercises, Aquatic therapy, Therapeutic activity, Neuro Muscular re-education, Gait training, Patient/Family education, Joint mobilization, Dry Needling, Electrical stimulation, Spinal mobilization and/or manipulation, Moist heat, Taping, Vasopneumatic device, Ionotophoresis 4mg /ml Dexamethasone, and Manual therapy   PLAN FOR NEXT SESSION: dc today Abryanna Musolino L Amiley Shishido PT 01/13/2023, 5:32 [facility] at [facility] [address] [facility]. [place], [place], [address] Phone: [phone]   Fax:  [phone]  Patient Details  Name: Kailah Pennel MRN: [id number] Date of Birth: [birth year] Referring Provider:  Hanvey, [place], MD  Encounter Date: 01/13/2023

## 2023-01-14 NOTE — Progress Notes (Unsigned)
Kristen Sexton is a 22 y.o. female who is here for Vit D follow-up, and nursing school admission paperwork.      Sent MyChart message with Vit D results - normal - 53 - back in onraml range- sent instructions for Vit D 2000 IU daily - Rx sent in Jan.    Discuss transition to adult care   [facility] [phone]   [facility] Nutrition and Diabetes [phone]   [facility] Weight Management [phone]. There is a wait list for this office but you can contact them for further details.     [place] - 6/10-7/20 -   Typhoid *** May 26th   Triamcinolaone *** ointment -- up to two weeks    Titers for nursing school?*** pended  orders + TDaP - paperwork is in my box including her physical exam   TDaP - good until 12/02/2013 - may want to go ahead and get booster dose today***  Previous healthy lifestyle goal: *** Achieved goal?: {YES NO:22349}   Endocrinology - NP,  [facility]   Obesity-related ROS: NEURO: Headaches: {YES/NO/WILD BMWUX:32440} ENT: snoring: {YES/NO/WILD CARDS:18581} Pulm: shortness of breath: {YES/NO/WILD CARDS:18581} ABD: abdominal pain: {YES/NO/WILD CARDS:18581} GU: polyuria, polydipsia: {YES/NO/WILD NUUVO:53664} MSK: joint pains: {YES/NO/WILD CARDS:18581}  Prior screening labs: ***  HPI:   How many servings of fruits do you eat a day? {1, 2, 3+:18709} How many vegetables do you eat a day? {1, 2, 3+:18709} How much time a day do you exercise or participate in active play?  {Time; 15 min - 8 hours:17441} How many cups of sugary drinks do you drink a day? {1, 2, 3+:18709} How many sweets do you eat a day? {1, 2, 3+:18709} How many times a week do you eat fast food?  {1, 2, 3+:18709} How many times a week do you eat breakfast?  {1, 2, 3+:18709} How much recreational screen time do you consume daily?  {Time; 15 min - 8 hours:17441}   {Common ambulatory SmartLinks:19316}   Physical Exam:  There were no vitals taken for this visit. Body  mass index: body mass index is unknown because there is no height or weight on file. Growth %ile SmartLinks can only be used for patients less than 56 years old. Growth %ile SmartLinks can only be used for patients less than 83 years old. Wt Readings from Last 3 Encounters:  11/21/22 191 lb (86.6 kg)  10/01/22 187 lb 12.8 oz (85.2 kg)  08/06/22 175 lb (79.4 kg) (93 %, Z= 1.49)*   * Growth percentiles are based on CDC (Girls, 2-20 Years) data.     General:   alert, cooperative, appears stated age and no distress  Skin:   Acanthosis nigricans,*** normal  Neck:  Neck appearance: Normal  Lungs:  clear to auscultation bilaterally  Heart:   regular rate and rhythm, S1, S2 normal, no murmur, click, rub or gallop   Abdomen:  soft, non-tender; bowel sounds normal; no masses,  no organomegaly  GU:  not examined  Neuro:  normal without focal findings     Assessment/Plan: Kristen Sexton is here today for healthy lifestyles follow-up. BMI significantly elevated with upward velocity***.  BP appropriate for age.***  Remains at increased risk for diabetes, HLD, HTN*** - Discussed My Plate and 4-0-3-4 goals of healthy active living. - Screening labs today to eval for hyperlipidemia and diabetes*** - Consider fasting lipid panel, Hgb A1 c or random glucose at follow-up*** - Consider referral to Nutrition at follow-up appt***  Today Kristen Sexton  and their guardian agrees to make the following changes to improve their weight.   1. *** 2. ***  No follow-ups on file.  Kristen B Geralyn Figiel, MD  01/14/23

## 2023-01-16 ENCOUNTER — Ambulatory Visit: Payer: Medicaid Other | Admitting: Dietician

## 2023-01-17 ENCOUNTER — Ambulatory Visit: Payer: Medicaid Other | Admitting: Pediatrics

## 2023-01-17 ENCOUNTER — Ambulatory Visit (INDEPENDENT_AMBULATORY_CARE_PROVIDER_SITE_OTHER): Payer: Medicaid Other | Admitting: Pediatrics

## 2023-01-17 ENCOUNTER — Encounter: Payer: Self-pay | Admitting: Pediatrics

## 2023-01-17 VITALS — BP 100/70 | Wt 190.6 lb

## 2023-01-17 DIAGNOSIS — Z0184 Encounter for antibody response examination: Secondary | ICD-10-CM | POA: Diagnosis not present

## 2023-01-17 DIAGNOSIS — L2089 Other atopic dermatitis: Secondary | ICD-10-CM | POA: Diagnosis not present

## 2023-01-17 DIAGNOSIS — Z23 Encounter for immunization: Secondary | ICD-10-CM

## 2023-01-17 DIAGNOSIS — B360 Pityriasis versicolor: Secondary | ICD-10-CM | POA: Diagnosis not present

## 2023-01-17 DIAGNOSIS — Z111 Encounter for screening for respiratory tuberculosis: Secondary | ICD-10-CM | POA: Diagnosis not present

## 2023-01-17 DIAGNOSIS — L210 Seborrhea capitis: Secondary | ICD-10-CM | POA: Diagnosis not present

## 2023-01-17 DIAGNOSIS — Z9189 Other specified personal risk factors, not elsewhere classified: Secondary | ICD-10-CM

## 2023-01-17 DIAGNOSIS — Z7184 Encounter for health counseling related to travel: Secondary | ICD-10-CM

## 2023-01-17 DIAGNOSIS — R7303 Prediabetes: Secondary | ICD-10-CM | POA: Diagnosis not present

## 2023-01-17 MED ORDER — KETOCONAZOLE 2 % EX SHAM: 1.0000 | MEDICATED_SHAMPOO | CUTANEOUS | 1 refills | Status: DC

## 2023-01-17 MED ORDER — TYPHOID VACCINE PO CPDR: 1.0000 | DELAYED_RELEASE_CAPSULE | ORAL | 0 refills | Status: DC

## 2023-01-17 MED ORDER — TRIAMCINOLONE ACETONIDE 0.025 % EX OINT: 1.0000 | TOPICAL_OINTMENT | Freq: Two times a day (BID) | CUTANEOUS | 1 refills | Status: DC

## 2023-01-17 NOTE — Patient Instructions (Addendum)
Thanks for letting me take care of you and your family.  It was a pleasure seeing you today.  Here's what we discussed:  Start triamcinolone 0.1% ointment on the red bumps TWO times per day.  Continue until smooth.  Do not use more than 2 weeks.   Typhoid vaccine - Finish taking all four pills (one pill every other day) at least 1 week before travel.  Start on 5/26.   Call Endocrinology for an appointment.  See below.   Continue Vitamin D 2000 units daily.  This is purchased over the counter.   Ask your Neurosurgeon to write a letter for you about your limitations.  Start ketoconazole shampoo 2% TWO times per week for a month.  Space a few days in between applications.  After one month, repeat once a week to keep the dandruff away.       [facility] [phone]   [facility] Nutrition and Diabetes [phone]   [facility] Medical Weight Management [phone]. There is a wait list for this office but you can contact them for further details.

## 2023-01-23 ENCOUNTER — Telehealth: Payer: Self-pay | Admitting: Pediatrics

## 2023-01-23 LAB — HEMOGLOBIN A1C
Hgb A1c MFr Bld: 6.1 % of total Hgb — ABNORMAL HIGH (ref <5.7)
Mean Plasma Glucose: 128 mg/dL
eAG (mmol/L): 7.1 mmol/L

## 2023-01-23 LAB — MEASLES/MUMPS/RUBELLA IMMUNITY
Mumps IgG: 60.8 AU/mL
Rubella: 2.98 Index
Rubeola IgG: >300 AU/mL

## 2023-01-23 LAB — HEPATITIS B SURFACE ANTIBODY,QUALITATIVE: Hep B S Ab: NONREACTIVE

## 2023-01-23 LAB — VARICELLA-ZOSTER VIRUS AB(IMMUNITY SCREEN),ACIF,SERUM: VARICELLA ZOSTER VIRUS AB (IMMUNITY SCR),ACIF SERUM: >=1:4 {titer}

## 2023-01-23 LAB — QUANTIFERON-TB GOLD PLUS
Mitogen-NIL: >10 IU/mL
NIL: 0.03 IU/mL
QuantiFERON-TB Gold Plus: NEGATIVE
TB1-NIL: 0.01 IU/mL
TB2-NIL: 0 IU/mL

## 2023-01-23 LAB — HEPATITIS B SURFACE ANTIGEN: Hepatitis B Surface Ag: NONREACTIVE

## 2023-01-23 NOTE — Telephone Encounter (Signed)
Placed in Dr. Lottie Rater folder for completion

## 2023-01-23 NOTE — Telephone Encounter (Signed)
Good morning,  Kristen Sexton stopped by to drop off a Staff Health Assessment form that needs to be completed. 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 She needs the immunizations/lab reports and stated that she needs each one to please be separate. She would like to pick up the staff health assessment and lab results all together. Please contact her once completed.  Kristen Sexton [phone]

## 2023-01-27 ENCOUNTER — Telehealth: Payer: Self-pay | Admitting: *Deleted

## 2023-01-27 NOTE — Telephone Encounter (Signed)
Blanca, please call Kristen Sexton tomorrow [phone]  01/28/23  for forms update. I did not see the work or school forms in [employer].I sent request to [facility] admin pool for copies of her lab work.

## 2023-01-27 NOTE — Telephone Encounter (Signed)
Kristen Sexton stopped by to drop off a Staff Health Assessment form that needs to be completed. 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 She needs the immunizations/lab reports and stated that she needs each one to please be separate. She would like to pick up the staff health assessment and lab results all together. Please contact her once completed.  (MAY 9) Kristen Sexton [phone]

## 2023-01-28 NOTE — Telephone Encounter (Signed)
[place] came to pick up paperwork. Copy sent to media to scan of work form.

## 2023-01-28 NOTE — Telephone Encounter (Signed)
Placed completed form for work, updated immunization record, and each lab result she received for pick up at the front. I called and made her aware it is ready for pick up and she will come get it tomorrow. Made her aware she will need to fill out a portion about TB on her work form prior to leaving. Please make copies of the work form once she has completed this portion.

## 2023-01-28 NOTE — Telephone Encounter (Signed)
Dr. Florestine Avers is going to work on the forms the afternoon. Notified Tanah that we will update her when finished.

## 2023-02-05 ENCOUNTER — Ambulatory Visit: Payer: Medicaid Other | Admitting: Student

## 2023-02-06 DIAGNOSIS — M5126 Other intervertebral disc displacement, lumbar region: Secondary | ICD-10-CM | POA: Diagnosis not present

## 2023-02-14 ENCOUNTER — Ambulatory Visit: Payer: Medicaid Other | Admitting: Student

## 2023-02-14 DIAGNOSIS — R5381 Other malaise: Secondary | ICD-10-CM | POA: Diagnosis not present

## 2023-02-14 DIAGNOSIS — R6883 Chills (without fever): Secondary | ICD-10-CM | POA: Diagnosis not present

## 2023-02-14 DIAGNOSIS — R070 Pain in throat: Secondary | ICD-10-CM | POA: Diagnosis not present

## 2023-02-14 DIAGNOSIS — J039 Acute tonsillitis, unspecified: Secondary | ICD-10-CM | POA: Diagnosis not present

## 2023-02-21 DIAGNOSIS — H5213 Myopia, bilateral: Secondary | ICD-10-CM | POA: Diagnosis not present

## 2023-02-22 ENCOUNTER — Encounter: Payer: Self-pay | Admitting: Pediatrics

## 2023-04-08 ENCOUNTER — Ambulatory Visit: Payer: Medicaid Other | Admitting: Pediatrics

## 2023-04-08 DIAGNOSIS — Z9189 Other specified personal risk factors, not elsewhere classified: Secondary | ICD-10-CM

## 2023-04-08 DIAGNOSIS — Z23 Encounter for immunization: Secondary | ICD-10-CM

## 2023-04-08 NOTE — Progress Notes (Signed)
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    Spoke with billing - they have provided approval to use private stock of Hep B vaccine for billing purposes.    Also confirmed plan with Zebedee Iba, RN  nursing supervisor.   Spoke with patient by phone -- I will have schedulers call her this afternoon to schedule:  1) Hep B vaccine only visit this week  2) lab visit in 4-6 weeks to recheck Hep B titers - future orders placed   Tonjia also confirmed that she will call to reschedule initial visit with adult primary care office.  Cancelled  appt x 2.  After titers, will plan to officially transition to adult medicine.  No additional appts to be scheduled at [facility].    Enis Gash, MD [facility]

## 2023-04-11 ENCOUNTER — Ambulatory Visit (INDEPENDENT_AMBULATORY_CARE_PROVIDER_SITE_OTHER): Payer: Medicaid Other | Admitting: Pediatrics

## 2023-04-11 ENCOUNTER — Ambulatory Visit: Payer: Medicaid Other | Admitting: Pediatrics

## 2023-04-11 DIAGNOSIS — S90211A Contusion of right great toe with damage to nail, initial encounter: Secondary | ICD-10-CM | POA: Diagnosis not present

## 2023-04-11 DIAGNOSIS — Z23 Encounter for immunization: Secondary | ICD-10-CM

## 2023-04-11 DIAGNOSIS — S90212A Contusion of left great toe with damage to nail, initial encounter: Secondary | ICD-10-CM | POA: Diagnosis not present

## 2023-04-11 NOTE — Progress Notes (Signed)
Vaccine only visit.  Did not see this provider.

## 2023-04-15 ENCOUNTER — Encounter: Payer: Self-pay | Admitting: Podiatry

## 2023-04-15 ENCOUNTER — Ambulatory Visit: Payer: Medicaid Other | Admitting: Podiatry

## 2023-04-15 DIAGNOSIS — S90219A Contusion of unspecified great toe with damage to nail, initial encounter: Secondary | ICD-10-CM | POA: Diagnosis not present

## 2023-04-15 NOTE — Progress Notes (Signed)
She presents today with a hallux nail bilaterally.  States that the toenails have been weak and getting discolored since the ingrown procedure was performed both and followed off recently on the left foot looks better than the right when does but I had acrylics put on them and ended up hitting the hallux on the right foot which lifted the entire nail up.  She is just wondering if there is anything she can do to help with the thickness and strength of the nails and possible discoloration.  Objective: Vital signs stable oriented x 3 she does have distal onycholysis of the hallux nail right hallux nail left appears to be growing out normally and there should be minimal discoloration once it grows on out.  I do think that the hallux nail right has had another trauma to the bed in the room possibly associated with her picking at the nail as well  Assessment: Nail dystrophy hallux bilateral.  Plan: Best plan is to wait and let these grow all the way out and then take samples of the skin and nail to send for pathologic evaluation.  And I explained to her not to pick at these and to make sure that she is taking the maximum dose of biotin.

## 2023-04-17 ENCOUNTER — Encounter: Payer: Self-pay | Admitting: Pediatrics

## 2023-04-17 DIAGNOSIS — B360 Pityriasis versicolor: Secondary | ICD-10-CM

## 2023-04-18 MED ORDER — KETOCONAZOLE 2 % EX CREA: 1.0000 | TOPICAL_CREAM | Freq: Every day | CUTANEOUS | 1 refills | Status: AC

## 2023-04-21 ENCOUNTER — Ambulatory Visit: Payer: Medicaid Other | Admitting: Podiatry

## 2023-05-12 ENCOUNTER — Other Ambulatory Visit: Payer: Medicaid Other

## 2023-05-13 ENCOUNTER — Other Ambulatory Visit: Payer: Medicaid Other

## 2023-05-13 DIAGNOSIS — Z9189 Other specified personal risk factors, not elsewhere classified: Secondary | ICD-10-CM | POA: Diagnosis not present

## 2023-05-14 LAB — HEPATITIS B SURFACE ANTIBODY,QUALITATIVE: Hep B S Ab: REACTIVE — AB

## 2023-05-14 LAB — HEPATITIS B SURFACE ANTIGEN: Hepatitis B Surface Ag: NONREACTIVE

## 2023-05-16 ENCOUNTER — Encounter: Payer: Self-pay | Admitting: *Deleted

## 2023-05-16 ENCOUNTER — Encounter: Payer: Self-pay | Admitting: Pediatrics

## 2023-05-22 DIAGNOSIS — H5213 Myopia, bilateral: Secondary | ICD-10-CM | POA: Diagnosis not present

## 2023-06-10 ENCOUNTER — Ambulatory Visit: Payer: Medicaid Other | Admitting: Student

## 2023-07-03 ENCOUNTER — Ambulatory Visit: Payer: Medicaid Other | Admitting: Student

## 2023-07-08 ENCOUNTER — Encounter: Payer: Self-pay | Admitting: Family

## 2023-07-08 ENCOUNTER — Ambulatory Visit: Payer: Medicaid Other | Admitting: Family

## 2023-07-08 VITALS — BP 110/70 | HR 97 | Resp 18 | Ht 66.0 in | Wt 187.0 lb

## 2023-07-08 DIAGNOSIS — E559 Vitamin D deficiency, unspecified: Secondary | ICD-10-CM | POA: Diagnosis not present

## 2023-07-08 DIAGNOSIS — R7309 Other abnormal glucose: Secondary | ICD-10-CM

## 2023-07-08 DIAGNOSIS — R7303 Prediabetes: Secondary | ICD-10-CM | POA: Diagnosis not present

## 2023-07-08 DIAGNOSIS — L659 Nonscarring hair loss, unspecified: Secondary | ICD-10-CM

## 2023-07-08 DIAGNOSIS — D509 Iron deficiency anemia, unspecified: Secondary | ICD-10-CM | POA: Diagnosis not present

## 2023-07-08 DIAGNOSIS — R5383 Other fatigue: Secondary | ICD-10-CM

## 2023-07-08 NOTE — Patient Instructions (Signed)
Ask mom to get a little more clarification about her thyroid;

## 2023-07-08 NOTE — Progress Notes (Signed)
Kristen Sexton is a 22 y.o. 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   No current facility-administered medications for this visit.    Allergies: American cockroach, Dust mite extract, and Grass pollen(k-o-r-t-swt vern)  Past Medical History:  Diagnosis Date   Allergy    Environmental allergies    Pre-diabetes     Past Surgical History:  Procedure Laterality Date   NO PAST SURGERIES      Family History  Problem Relation Age of Onset   Allergic rhinitis Neg Hx    Angioedema Neg Hx    Asthma Neg Hx    Eczema Neg Hx    Immunodeficiency Neg Hx    Urticaria Neg Hx    Irritable bowel syndrome Neg Hx    Celiac disease Neg Hx    GER disease Neg Hx     Social History   Tobacco Use   Smoking status: Never   Smokeless tobacco: Never  Substance Use Topics   Alcohol use: Never    Subjective:   Presents today as a new patient; requesting to have updated labs drawn- concerned about hair loss; admits that stress level is very high; is currently in nursing school;  Known pre-diabetes- did meet with nutritionist in the past few months and  working on balancing school and healthy food choices;   LMP 06/21/2023; not sexually active  Objective:  Vitals:   07/08/23 1358  BP: 110/70  Pulse: 97  Resp: 18  SpO2: 99%  Weight: 187 lb (84.8 kg)  Height: 5\' 6"  (1.676 m)    General: Well developed, well nourished, in no acute distress  Skin : Warm and dry.  Head: Normocephalic and atraumatic  Eyes: Sclera and conjunctiva clear; pupils round and reactive to light; extraocular movements intact  Neck: Supple without thyromegaly, adenopathy  Lungs: Respirations unlabored; clear to auscultation bilaterally without wheeze, rales, rhonchi  CVS exam: normal rate and regular rhythm.  Musculoskeletal: No deformities; no active joint inflammation  Extremities: No edema, cyanosis, clubbing  Vessels: Symmetric bilaterally  Neurologic: Alert and oriented; speech intact; face symmetrical; moves all extremities well; CNII-XII intact without focal deficit   Assessment:  1. Other fatigue   2. Iron deficiency anemia, unspecified iron deficiency anemia type   3. Vitamin D deficiency   4. Elevated glucose   5. Pre-diabetes   6. Alopecia     Plan:  Will update labs today; follow up to be determined based on lab results; discussed importance of trying to meal plan to allow her to make healthy food choices while she is in school; also discussed benefit that exercise can offer to help with stress reduction; follow up to be  determined.   No follow-ups on file.  Orders Placed This Encounter  Procedures   CBC with Differential/Platelet   Comp Met (CMET)   IBC + Ferritin   TSH   Vitamin D (25 hydroxy)   Hemoglobin A1c   B12   Antinuclear Antib (ANA)   Sedimentation rate    Requested Prescriptions    No prescriptions requested or ordered in this encounter

## 2023-07-09 LAB — COMPREHENSIVE METABOLIC PANEL
ALT: 12 U/L (ref 0–35)
AST: 15 U/L (ref 0–37)
Albumin: 4.4 g/dL (ref 3.5–5.2)
Alkaline Phosphatase: 66 U/L (ref 39–117)
BUN: 9 mg/dL (ref 6–23)
CO2: 24 meq/L (ref 19–32)
Calcium: 9.3 mg/dL (ref 8.4–10.5)
Chloride: 103 meq/L (ref 96–112)
Creatinine, Ser: 0.66 mg/dL (ref 0.40–1.20)
GFR: 125.93 mL/min (ref >60.00)
Glucose, Bld: 108 mg/dL — ABNORMAL HIGH (ref 70–99)
Potassium: 3.7 meq/L (ref 3.5–5.1)
Sodium: 137 meq/L (ref 135–145)
Total Bilirubin: 0.4 mg/dL (ref 0.2–1.2)
Total Protein: 7.1 g/dL (ref 6.0–8.3)

## 2023-07-09 LAB — CBC WITH DIFFERENTIAL/PLATELET
Basophils Absolute: 0.1 10*3/uL (ref 0.0–0.1)
Basophils Relative: 0.7 % (ref 0.0–3.0)
Eosinophils Absolute: 0.1 10*3/uL (ref 0.0–0.7)
Eosinophils Relative: 0.9 % (ref 0.0–5.0)
HCT: 43.5 % (ref 36.0–46.0)
Hemoglobin: 13.8 g/dL (ref 12.0–15.0)
Lymphocytes Relative: 20.8 % (ref 12.0–46.0)
Lymphs Abs: 2.9 10*3/uL (ref 0.7–4.0)
MCHC: 31.6 g/dL (ref 30.0–36.0)
MCV: 83.5 fL (ref 78.0–100.0)
Monocytes Absolute: 1.1 10*3/uL — ABNORMAL HIGH (ref 0.1–1.0)
Monocytes Relative: 8 % (ref 3.0–12.0)
Neutro Abs: 9.8 10*3/uL — ABNORMAL HIGH (ref 1.4–7.7)
Neutrophils Relative %: 69.6 % (ref 43.0–77.0)
Platelets: 323 10*3/uL (ref 150.0–400.0)
RBC: 5.21 Mil/uL — ABNORMAL HIGH (ref 3.87–5.11)
RDW: 13.2 % (ref 11.5–14.6)
WBC: 14.1 10*3/uL — ABNORMAL HIGH (ref 4.5–10.5)

## 2023-07-09 LAB — HEMOGLOBIN A1C: Hgb A1c MFr Bld: 5.9 % (ref 4.6–6.5)

## 2023-07-09 LAB — VITAMIN D 25 HYDROXY (VIT D DEFICIENCY, FRACTURES): VITD: 15.18 ng/mL — ABNORMAL LOW (ref 30.00–100.00)

## 2023-07-09 LAB — VITAMIN B12: Vitamin B-12: 200 pg/mL — ABNORMAL LOW (ref 211–911)

## 2023-07-09 LAB — IBC + FERRITIN
Ferritin: 18.5 ng/mL (ref 10.0–291.0)
Iron: 110 ug/dL (ref 42–145)
Saturation Ratios: 24.9 % (ref 20.0–50.0)
TIBC: 441 ug/dL (ref 250.0–450.0)
Transferrin: 315 mg/dL (ref 212.0–360.0)

## 2023-07-09 LAB — ANA: Anti Nuclear Antibody (ANA): NEGATIVE

## 2023-07-09 LAB — SEDIMENTATION RATE: Sed Rate: 28 mm/h — ABNORMAL HIGH (ref 0–20)

## 2023-07-09 LAB — TSH: TSH: 1.32 u[IU]/mL (ref 0.35–5.50)

## 2023-07-11 ENCOUNTER — Other Ambulatory Visit: Payer: Self-pay | Admitting: Family

## 2023-07-11 DIAGNOSIS — R899 Unspecified abnormal finding in specimens from other organs, systems and tissues: Secondary | ICD-10-CM

## 2023-07-11 MED ORDER — VITAMIN D (ERGOCALCIFEROL) 1.25 MG (50000 UNIT) PO CAPS: 50000.0000 [IU] | ORAL_CAPSULE | ORAL | 0 refills | Status: AC

## 2023-07-22 ENCOUNTER — Ambulatory Visit: Payer: Medicaid Other

## 2023-07-22 ENCOUNTER — Telehealth: Payer: Self-pay | Admitting: Family

## 2023-07-22 NOTE — Telephone Encounter (Signed)
Called pt and left a VM to inform pt of correct OTC dosage, advised pt she will take one tablet a day and will still plan to recheck labs as previously planned. Advised pt to call the office back with any further questions or concerns.

## 2023-07-22 NOTE — Telephone Encounter (Signed)
Pt called stating that she would like to take a vitamin supplement for her B12 rather than having the shots done. Pt would like a call when this change is made.

## 2023-08-04 ENCOUNTER — Telehealth: Payer: Self-pay | Admitting: Family

## 2023-08-04 NOTE — Telephone Encounter (Signed)
Pt called and requested to transfer care with a full time PCP. Please advise if request can be approved.

## 2023-08-10 DIAGNOSIS — L03111 Cellulitis of right axilla: Secondary | ICD-10-CM | POA: Diagnosis not present

## 2023-08-11 DIAGNOSIS — H5213 Myopia, bilateral: Secondary | ICD-10-CM | POA: Diagnosis not present

## 2023-08-12 NOTE — Progress Notes (Unsigned)
      Established patient visit   Patient: Kristen Sexton   DOB: [birth year]   22 y.o. Female  MRN: [id number] Visit Date: 08/13/2023  Today's healthcare provider: Alfredia Ferguson, PA-C   No chief complaint on file.  Subjective     ***  Medications: Outpatient Medications Prior to Visit  Medication Sig   Vitamin D, Ergocalciferol, (DRISDOL) 1.25 MG (50000 UNIT) CAPS capsule Take 1 capsule (50,000 Units total) by mouth every 7 (seven) days for 12 doses.   No facility-administered medications prior to visit.    Review of Systems {Insert previous labs (optional):23779} {See past labs  Heme  Chem  Endocrine  Serology  Results Review (optional):1}   Objective    There were no vitals taken for this visit. {Insert last BP/Wt (optional):23777}{See vitals history (optional):1}  Physical Exam  ***  No results found for any visits on 08/13/23.  Assessment & Plan    There are no diagnoses linked to this encounter.  ***  No follow-ups on file.       Alfredia Ferguson, PA-C  [facility] at [facility] [phone] (phone) [phone] (fax)  [facility]

## 2023-08-13 ENCOUNTER — Ambulatory Visit: Payer: Medicaid Other | Admitting: Physician Assistant

## 2023-08-13 ENCOUNTER — Encounter: Payer: Self-pay | Admitting: Physician Assistant

## 2023-08-13 VITALS — BP 112/76 | HR 93 | Temp 97.7°F | Ht 66.0 in | Wt 188.8 lb

## 2023-08-13 DIAGNOSIS — L910 Hypertrophic scar: Secondary | ICD-10-CM

## 2023-08-13 DIAGNOSIS — L650 Telogen effluvium: Secondary | ICD-10-CM | POA: Diagnosis not present

## 2023-08-13 DIAGNOSIS — L723 Sebaceous cyst: Secondary | ICD-10-CM

## 2023-08-13 DIAGNOSIS — E559 Vitamin D deficiency, unspecified: Secondary | ICD-10-CM

## 2023-08-22 ENCOUNTER — Other Ambulatory Visit: Payer: Medicaid Other

## 2023-08-22 ENCOUNTER — Other Ambulatory Visit (INDEPENDENT_AMBULATORY_CARE_PROVIDER_SITE_OTHER): Payer: Medicaid Other

## 2023-08-22 DIAGNOSIS — R899 Unspecified abnormal finding in specimens from other organs, systems and tissues: Secondary | ICD-10-CM

## 2023-08-22 DIAGNOSIS — E559 Vitamin D deficiency, unspecified: Secondary | ICD-10-CM | POA: Diagnosis not present

## 2023-08-22 NOTE — Addendum Note (Signed)
Addended by: Mervin Kung A on: 08/22/2023 02:41 PM   Modules accepted: Orders

## 2023-08-22 NOTE — Addendum Note (Signed)
Addended by: Mervin Kung A on: 08/22/2023 02:42 PM   Modules accepted: Orders

## 2023-08-23 LAB — CBC WITH DIFFERENTIAL/PLATELET
Absolute Lymphocytes: 2702 {cells}/uL (ref 850–3900)
Absolute Monocytes: 581 {cells}/uL (ref 200–950)
Basophils Absolute: 53 {cells}/uL (ref 0–200)
Basophils Relative: 0.6 %
Eosinophils Absolute: 158 {cells}/uL (ref 15–500)
Eosinophils Relative: 1.8 %
HCT: 44.7 % (ref 35.0–45.0)
Hemoglobin: 14.6 g/dL (ref 11.7–15.5)
MCH: 26.6 pg — ABNORMAL LOW (ref 27.0–33.0)
MCHC: 32.7 g/dL (ref 32.0–36.0)
MCV: 81.4 fL (ref 80.0–100.0)
MPV: 11.4 fL (ref 7.5–12.5)
Monocytes Relative: 6.6 %
Neutro Abs: 5306 {cells}/uL (ref 1500–7800)
Neutrophils Relative %: 60.3 %
Platelets: 338 10*3/uL (ref 140–400)
RBC: 5.49 10*6/uL — ABNORMAL HIGH (ref 3.80–5.10)
RDW: 12.1 % (ref 11.0–15.0)
Total Lymphocyte: 30.7 %
WBC: 8.8 10*3/uL (ref 3.8–10.8)

## 2023-08-23 LAB — VITAMIN B12: Vitamin B-12: 690 pg/mL (ref 200–1100)

## 2023-08-23 LAB — VITAMIN D 25 HYDROXY (VIT D DEFICIENCY, FRACTURES): Vit D, 25-Hydroxy: 46 ng/mL (ref 30–100)

## 2023-08-26 ENCOUNTER — Other Ambulatory Visit: Payer: Medicaid Other

## 2023-10-24 ENCOUNTER — Ambulatory Visit (INDEPENDENT_AMBULATORY_CARE_PROVIDER_SITE_OTHER): Payer: Managed Care, Other (non HMO) | Admitting: Family

## 2023-10-24 VITALS — BP 110/62 | HR 94 | Ht 66.0 in | Wt 184.8 lb

## 2023-10-24 DIAGNOSIS — E538 Deficiency of other specified B group vitamins: Secondary | ICD-10-CM | POA: Diagnosis not present

## 2023-10-24 DIAGNOSIS — R7303 Prediabetes: Secondary | ICD-10-CM

## 2023-10-24 NOTE — Patient Instructions (Signed)
 We will check your labs as requested and you will need to follow up with Heidi Llamas with further questions or concerns.

## 2023-10-24 NOTE — Progress Notes (Signed)
  Kristen Sexton is a 23 y.o. 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     No current facility-administered medications for this visit.    Allergies: American cockroach, Dust mite extract, and Grass pollen(k-o-r-t-swt vern)  Past Medical History:  Diagnosis Date   Allergy    Environmental allergies    Pre-diabetes     Past Surgical History:  Procedure Laterality Date   NO PAST SURGERIES      Family History  Problem Relation Age of Onset   Allergic rhinitis Neg Hx    Angioedema Neg Hx    Asthma Neg Hx    Eczema Neg Hx    Immunodeficiency Neg Hx    Urticaria Neg Hx    Irritable bowel syndrome Neg Hx    Celiac disease Neg Hx    GER disease Neg Hx     Social History   Tobacco Use   Smoking status: Never   Smokeless tobacco: Never  Substance Use Topics   Alcohol use: Never    Subjective:   Known history of B12 deficiency- admits is not taking her B12 supplement;  Vitamin D  deficiency- not currently taking her supplement;   Requesting to have her labs updated;    Objective:  Vitals:   10/24/23 1417  BP: 110/62  Pulse: 94  SpO2: 98%  Weight: 184 lb 12.8 oz (83.8 kg)  Height: 5' 6 (1.676 m)    General: Well developed, well nourished, in no acute distress  Skin : Warm and dry.  Head: Normocephalic and atraumatic  Neck: Supple without thyromegaly, adenopathy  Lungs: Respirations unlabored; clear to auscultation bilaterally without wheeze, rales, rhonchi  CVS exam: normal rate and regular rhythm.  Neurologic: Alert and oriented; speech intact; face symmetrical; moves all extremities well; CNII-XII intact without focal deficit   Assessment:  1. Pre-diabetes   2. Low serum vitamin B12     Plan:  Update Hgba1c today;  Check B12 today; stressed to patient to take her B12 supplement daily;  Also discussed with patient that she needs to take her Vitamin D  supplement daily;   No follow-ups on file.  Orders Placed This Encounter  Procedures   Hemoglobin A1c   B12    Requested Prescriptions    No prescriptions requested or ordered in this encounter

## 2023-10-25 LAB — HEMOGLOBIN A1C
Hgb A1c MFr Bld: 6 %{Hb} — ABNORMAL HIGH (ref <5.7)
Mean Plasma Glucose: 126 mg/dL
eAG (mmol/L): 7 mmol/L

## 2023-10-25 LAB — VITAMIN B12: Vitamin B-12: 355 pg/mL (ref 200–1100)

## 2023-10-27 ENCOUNTER — Encounter: Payer: Self-pay | Admitting: Family

## 2023-12-22 ENCOUNTER — Encounter: Admitting: Physician Assistant

## 2023-12-30 NOTE — Progress Notes (Unsigned)
      Established patient visit   Patient: Kristen Sexton   DOB: 05/24/2001   23 y.o. Female  MRN: [id number] Visit Date: 12/31/2023  Today's healthcare provider: Trenton Frock, PA-C   No chief complaint on file.  Subjective     ***  Medications: No outpatient medications prior to visit.   No facility-administered medications prior to visit.    Review of Systems {Insert previous labs (optional):23779} {See past labs  Heme  Chem  Endocrine  Serology  Results Review (optional):1}   Objective    There were no vitals taken for this visit. {Insert last BP/Wt (optional):23777}{See vitals history (optional):1}  Physical Exam  ***  No results found for any visits on 12/31/23.  Assessment & Plan    There are no diagnoses linked to this encounter.  ***  No follow-ups on file.       Trenton Frock, PA-C  [facility] at [facility] [phone] (phone) [phone] (fax)  [facility]

## 2023-12-31 ENCOUNTER — Ambulatory Visit (INDEPENDENT_AMBULATORY_CARE_PROVIDER_SITE_OTHER): Admitting: Physician Assistant

## 2023-12-31 ENCOUNTER — Encounter: Payer: Self-pay | Admitting: Physician Assistant

## 2023-12-31 VITALS — BP 112/76 | HR 92 | Ht 66.0 in | Wt 187.2 lb

## 2023-12-31 DIAGNOSIS — Z111 Encounter for screening for respiratory tuberculosis: Secondary | ICD-10-CM

## 2024-01-02 LAB — QUANTIFERON-TB GOLD PLUS
Mitogen-NIL: >10 [IU]/mL
NIL: 0.04 [IU]/mL
QuantiFERON-TB Gold Plus: NEGATIVE
TB1-NIL: 0 [IU]/mL
TB2-NIL: <0 [IU]/mL

## 2024-01-15 ENCOUNTER — Encounter: Admitting: Physician Assistant

## 2024-02-11 ENCOUNTER — Encounter: Payer: Self-pay | Admitting: Physician Assistant

## 2024-02-11 ENCOUNTER — Other Ambulatory Visit: Payer: Self-pay | Admitting: Physician Assistant

## 2024-02-11 DIAGNOSIS — B360 Pityriasis versicolor: Secondary | ICD-10-CM

## 2024-02-11 MED ORDER — KETOCONAZOLE 2 % EX SHAM: 1.0000 | MEDICATED_SHAMPOO | CUTANEOUS | 1 refills | Status: AC

## 2024-02-18 ENCOUNTER — Other Ambulatory Visit: Payer: Self-pay | Admitting: Physician Assistant

## 2024-02-18 DIAGNOSIS — L918 Other hypertrophic disorders of the skin: Secondary | ICD-10-CM

## 2024-03-16 HISTORY — PX: WISDOM TOOTH EXTRACTION: SHX21

## 2024-05-24 ENCOUNTER — Telehealth: Payer: Self-pay | Admitting: Neurology

## 2024-05-24 NOTE — Telephone Encounter (Signed)
 Spoke with patient. She states for her anxiety she has accommodations in high school for extra time on testing. Is it okay to write a letter that states due to her anxiety she requires more time to complete testing?

## 2024-05-24 NOTE — Telephone Encounter (Signed)
 Letter written via mychart. Message sent.

## 2024-05-24 NOTE — Telephone Encounter (Signed)
 Copied from CRM [id number]. 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 Patient would like to know if its possible to get this written without being seen. She does have class from 4-7:30pm if she doesn't answer please text or leave VM. If unable to reach out today, she able to be reached tomorrow anytime before 3p.m.

## 2024-05-24 NOTE — Telephone Encounter (Signed)
 Yes, that's fine. Thank you!

## 2024-06-17 ENCOUNTER — Encounter: Payer: Self-pay | Admitting: Dermatology

## 2024-06-17 ENCOUNTER — Ambulatory Visit: Admitting: Dermatology

## 2024-06-17 VITALS — BP 91/73 | HR 103

## 2024-06-17 DIAGNOSIS — L910 Hypertrophic scar: Secondary | ICD-10-CM | POA: Diagnosis not present

## 2024-06-17 DIAGNOSIS — L309 Dermatitis, unspecified: Secondary | ICD-10-CM | POA: Diagnosis not present

## 2024-06-17 DIAGNOSIS — B360 Pityriasis versicolor: Secondary | ICD-10-CM

## 2024-06-17 MED ORDER — KETOCONAZOLE 2 % EX CREA: 1.0000 | TOPICAL_CREAM | Freq: Two times a day (BID) | CUTANEOUS | 4 refills | Status: AC

## 2024-06-17 MED ORDER — TRIAMCINOLONE ACETONIDE 0.1 % EX CREA: 1.0000 | TOPICAL_CREAM | Freq: Two times a day (BID) | CUTANEOUS | 3 refills | Status: AC | PRN

## 2024-06-17 MED ORDER — TRIAMCINOLONE ACETONIDE 40 MG/ML IJ SUSP
40.0000 mg | Freq: Once | INTRAMUSCULAR | Status: AC
  Administered 2024-06-17: 40 mg via INTRAMUSCULAR

## 2024-06-17 NOTE — Patient Instructions (Signed)

## 2024-06-17 NOTE — Progress Notes (Signed)
 New Patient Visit   Subjective  Kristen Sexton is a 23 y.o. female who presents for the following: Keloid treatment  Patient states she has keloids located at the ears that she would like to have examined. Patient reports the areas have been there for 1 year. She reports the areas are not bothersome.Patient rates irritation 0 out of 10. She states that the areas have not spread. Patient reports she has not previously been treated for these areas. Patient no Hx of bx. Patient no family history of skin cancer(s).  The patient has spots, moles and lesions to be evaluated, some may be new or changing and the patient may have concern these could be cancer.   The following portions of the chart were reviewed this encounter and updated as appropriate: medications, allergies, medical history  Review of Systems:  No other skin or systemic complaints except as noted in HPI or Assessment and Plan.  Objective  Well appearing patient in no apparent distress; mood and affect are within normal limits.   A focused examination was performed of the following areas: Ears and face  Relevant exam findings are noted in the Assessment and Plan.    Assessment & Plan   Keloid Small keloid on the ear, likely due to a piercing, attributed to genetic predisposition. Increased risk of keloid formation after puberty, especially in individuals of African, Arabic, or Asian descent. The keloid is not infected. - Inject Kenalog  into the keloid to reduce its size. (See procedure note below)  - Advise the use of pressure buttons after one week to help flatten the keloid.  Dermatitis  Perifollicular erythema and inflammation on the legs, likely due to mechanical exfoliation and waxing, exacerbated by rough exfoliating gloves and [place] black soap. Shaving previously caused hyperpigmentation and scarring. - Advise discontinuation of mechanical exfoliation and rough scrubbing. - Prescribe triamcinolone  cream to be  mixed with lotion and applied twice daily for two weeks on, two weeks off, for two months. - Recommend using Aveeno shaving gel and Gillette Mach5 Fusion Gentle razor if shaving is necessary. - Discuss the potential benefits of laser hair removal as a long-term solution.  Tinea versicolor Recurrent tinea versicolor, exacerbated by sweating and heat, such as during visits to [place]. She has been using ketoconazole  shampoo but prefers ketoconazole  cream. - Prescribe ketoconazole  cream with multiple refills for ongoing management. - Instruct to apply the cream twice daily for four weeks, then once weekly as maintenance before summer. - Recommend using exfoliating lotions with urea or salicylic acid to enhance penetration and normalize skin color.     KELOID Exam shows Firm pink/brown dermal papule(s)/plaque(s).  Treatment Plan: Kenalog  injections and pressure earrings   Location: left posterior ear  Informed Consent: Discussed risks (infection, pain, bleeding, bruising, thinning of the skin, loss of skin pigment, lack of resolution, and recurrence of lesion) and benefits of the procedure, as well as the alternatives. Informed consent was obtained. Preparation: The area was prepared a standard fashion.  Anesthesia: None  Procedure Details: An intralesional injection was performed with Kenalog  40 mg/cc. SABRA05 cc in total were injected.  Total number of injections: 2  Plan: The patient was instructed on post-op care. Recommend OTC analgesia as needed for pain.      No follow-ups on file.  IBerwyn Lesches, Surg Tech III, am acting as scribe for [employer], DO.   Documentation: I have reviewed the above documentation for accuracy and completeness, and I agree with the above.  Delon Lenis, DO

## 2024-06-23 ENCOUNTER — Encounter: Payer: Self-pay | Admitting: Dermatology

## 2024-07-14 ENCOUNTER — Encounter: Admitting: Family Medicine

## 2024-08-10 ENCOUNTER — Ambulatory Visit: Payer: Self-pay

## 2024-08-10 NOTE — Telephone Encounter (Signed)
 FYI Only or Action Required?: Action required by provider: request for appointment.  Patient was last seen in primary care on 12/31/2023 by Cyndi Shaver, PA-C.  Called Nurse Triage reporting Dizziness.  Symptoms began about a month ago.  Interventions attempted: Nothing.  Symptoms are: unchanged. Several episodes of dizziness. Feels like it is related to her blood sugar. Request acute visit.  Triage Disposition: See Physician Within 24 Hours  Patient/caregiver understands and will follow disposition?: Yes     Copied from CRM #[id number]. Topic: Clinical - Red Word Triage >> Aug 10, 2024  1:32 PM Kristen Sexton wrote: Red Word that prompted transfer to Nurse Triage: Has had moments where she is about to pass out, was pre diabetic and thinks that has something to do with the passing out, skin concerns. Seems very concerned, and worried about health. Reason for Disposition  [1] MODERATE dizziness (e.g., interferes with normal activities) AND [2] has NOT been evaluated by doctor (or NP/PA) for this  (Exception: Dizziness caused by heat exposure, sudden standing, or poor fluid intake.)  Answer Assessment - Initial Assessment Questions 1. DESCRIPTION: Describe your dizziness.     dizzy 2. LIGHTHEADED: Do you feel lightheaded? (e.g., somewhat faint, woozy, weak upon standing)     yes 3. VERTIGO: Do you feel like either you or the room is spinning or tilting? (i.e., vertigo)     no 4. SEVERITY: How bad is it?  Do you feel like you are going to faint? Can you stand and walk?     yes 5. ONSET:  When did the dizziness begin?     1 month 6. AGGRAVATING FACTORS: Does anything make it worse? (e.g., standing, change in head position)     standing 7. HEART RATE: Can you tell me your heart rate? How many beats in 15 seconds?  (Note: Not all patients can do this.)       no 8. CAUSE: What do you think is causing the dizziness? (e.g., decreased fluids or food, diarrhea, emotional  distress, heat exposure, new medicine, sudden standing, vomiting; unknown)     Low blood sugar 9. RECURRENT SYMPTOM: Have you had dizziness before? If Yes, ask: When was the last time? What happened that time?     no 10. OTHER SYMPTOMS: Do you have any other symptoms? (e.g., fever, chest pain, vomiting, diarrhea, bleeding)       no 11. PREGNANCY: Is there any chance you are pregnant? When was your last menstrual period?       no  Protocols used: Dizziness - Lightheadedness-A-AH

## 2024-08-18 ENCOUNTER — Ambulatory Visit: Admitting: Family Medicine

## 2024-08-19 ENCOUNTER — Ambulatory Visit: Admitting: Family Medicine

## 2024-08-19 NOTE — Progress Notes (Incomplete)
 Acute Office Visit  Subjective:  Patient ID: Kristen Sexton, female    DOB: Jan 19, 2001  Age: 23 y.o. MRN: [id number]  CC: No chief complaint on file.     HPI Kristen Sexton is here for dizziness.        Past Medical History:  Diagnosis Date   Allergy    Environmental allergies    Pre-diabetes     Past Surgical History:  Procedure Laterality Date   NO PAST SURGERIES      Family History  Problem Relation Age of Onset   Allergic rhinitis Neg Hx    Angioedema Neg Hx    Asthma Neg Hx    Eczema Neg Hx    Immunodeficiency Neg Hx    Urticaria Neg Hx    Irritable bowel syndrome Neg Hx    Celiac disease Neg Hx    GER disease Neg Hx     Social History   Socioeconomic History   Marital status: Single    Spouse name: Not on file   Number of children: Not on file   Years of education: Not on file   Highest education level: GED or equivalent  Occupational History   Not on file  Tobacco Use   Smoking status: Never   Smokeless tobacco: Never  Vaping Use   Vaping status: Never Used  Substance and Sexual Activity   Alcohol use: Never   Drug use: Never   Sexual activity: Not on file  Other Topics Concern   Not on file  Social History Narrative   10th grade at [employer]   Social Drivers of Health   Financial Resource Strain: Medium Risk (08/18/2024)   Overall Financial Resource Strain (CARDIA)    Difficulty of Paying Living Expenses: Somewhat hard  Food Insecurity: Food Insecurity Present (08/18/2024)   Hunger Vital Sign    Worried About Running Out of Food in the Last Year: Never true    Ran Out of Food in the Last Year: Sometimes true  Transportation Needs: No Transportation Needs (08/18/2024)   PRAPARE - Administrator, Civil Service (Medical): No    Lack of Transportation (Non-Medical): No  Physical Activity: Insufficiently Active (08/18/2024)   Exercise Vital Sign    Days of Exercise per Week: 3 days    Minutes of Exercise per Session: 30 min   Stress: No Stress Concern Present (08/18/2024)   [employer] of Occupational Health - Occupational Stress Questionnaire    Feeling of Stress: Only a little  Social Connections: Moderately Isolated (08/18/2024)   Social Connection and Isolation Panel    Frequency of Communication with Friends and Family: More than three times a week    Frequency of Social Gatherings with Friends and Family: More than three times a week    Attends Religious Services: More than 4 times per year    Active Member of [employer] or Organizations: No    Attends Engineer, Structural: Not on file    Marital Status: Never married  Intimate Partner Violence: Not on file    ROS All ROS negative except what is listed in the HPI.   Objective:   Today's Vitals: There were no vitals taken for this visit.  Physical Exam  Assessment & Plan:   Problem List Items Addressed This Visit   None     Follow-up: No follow-ups on file.   Waddell FURY Almarie, DNP, FNP-C  I,Emily Lagle,acting as a neurosurgeon for Waddell KATHEE Almarie, NP.,have documented all relevant documentation  on the behalf of Waddell KATHEE Mon, NP.   I, Waddell KATHEE Mon, NP, have reviewed all documentation for this visit. The documentation on 08/19/2024 for the exam, diagnosis, procedures, and orders are all accurate and complete.

## 2024-08-26 ENCOUNTER — Ambulatory Visit: Attending: Family Medicine

## 2024-08-26 ENCOUNTER — Ambulatory Visit: Admitting: Family Medicine

## 2024-08-26 ENCOUNTER — Encounter: Payer: Self-pay | Admitting: Family Medicine

## 2024-08-26 VITALS — BP 106/56 | HR 108 | Temp 98.0°F | Resp 16 | Ht 66.0 in | Wt 195.6 lb

## 2024-08-26 DIAGNOSIS — R002 Palpitations: Secondary | ICD-10-CM

## 2024-08-26 DIAGNOSIS — E559 Vitamin D deficiency, unspecified: Secondary | ICD-10-CM | POA: Diagnosis not present

## 2024-08-26 DIAGNOSIS — Z6831 Body mass index (BMI) 31.0-31.9, adult: Secondary | ICD-10-CM | POA: Diagnosis not present

## 2024-08-26 DIAGNOSIS — R7303 Prediabetes: Secondary | ICD-10-CM | POA: Diagnosis not present

## 2024-08-26 DIAGNOSIS — R5383 Other fatigue: Secondary | ICD-10-CM | POA: Diagnosis not present

## 2024-08-26 DIAGNOSIS — R42 Dizziness and giddiness: Secondary | ICD-10-CM

## 2024-08-26 DIAGNOSIS — E66811 Obesity, class 1: Secondary | ICD-10-CM

## 2024-08-26 NOTE — Progress Notes (Unsigned)
 EP to read.

## 2024-08-26 NOTE — Progress Notes (Signed)
 Acute Office Visit  Subjective:  Patient ID: Kristen Sexton, female    DOB: October 01, 2000  Age: 23 y.o. MRN: [id number]  CC:  Chief Complaint  Patient presents with   Dizziness    Patient has been feeling lightheaded and some dizziness, since August (off & on), despite having normal appetite.  Patient has also been gaining weight but wants labs to evaluate pre-diabetes Patient also has discoloration under arms, related to pre-diabetes       HPI Kristen Sexton is here for dizziness.   Discussed the use of AI scribe software for clinical note transcription with the patient, who gave verbal consent to proceed.  History of Present Illness Kristen Sexton is a 23 year old female with prediabetes who presents with concerns of weight gain and dizziness.  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 Despite regular gym attendance, she finds it difficult to lose weight.  She experiences dizziness two to three times a week, particularly when fasting in the morning and after eating breakfast. During these episodes, she experiences nausea and a sensation of palpitations but no chest pain or shortness of breath. These symptoms have been present since August. She also feels dizzy when changing positions, such as standing up quickly.  She has a history of prediabetes with an A1c of 6.0% recorded in February. She reports poor dietary habits since then and is concerned about her blood sugar levels. No excessive thirst. She also experiences constipation and headaches.  She has a history of low vitamin D  and B12 levels but no history of low iron. She reports fatigue and dizziness, particularly when she has not eaten or after overeating, which she attributes to consuming high-carb meals. She does not use a glucometer and has not checked her blood sugar  levels.  She experiences a sensation of hunger even after eating a full meal.  No leathery feeling or darkening of the skin on the back of her neck, but she reports hyperpigmentation under her arms.         Past Medical History:  Diagnosis Date   Allergy    Environmental allergies    Pre-diabetes     Past Surgical History:  Procedure Laterality Date   NO PAST SURGERIES     WISDOM TOOTH EXTRACTION Bilateral 03/16/2024   3 Wisdom teeth    Family History  Problem Relation Age of Onset   Hyperlipidemia Mother    Allergic rhinitis Neg Hx    Angioedema Neg Hx    Asthma Neg Hx    Eczema Neg Hx    Immunodeficiency Neg Hx    Urticaria Neg Hx    Irritable bowel syndrome Neg Hx    Celiac disease Neg Hx    GER disease Neg Hx     Social History   Socioeconomic History   Marital status: Single    Spouse name: Not on file   Number of children: Not on file   Years of education: Not on file   Highest education level: GED or equivalent  Occupational History   Not on file  Tobacco Use   Smoking status: Never   Smokeless tobacco: Never  Vaping Use   Vaping status: Never Used  Substance and Sexual Activity   Alcohol use: Never   Drug use: Never   Sexual activity: Never    Birth control/protection: None  Other Topics Concern   Not on file  Social History Narrative   10th grade at [employer]   Social Drivers of Health   Tobacco Use: Low Risk (08/26/2024)   Patient History    Smoking Tobacco Use: Never    Smokeless Tobacco Use: Never    Passive Exposure: Not on file  Financial Resource Strain: Medium Risk (08/18/2024)   Overall Financial Resource Strain (CARDIA)    Difficulty of Paying Living Expenses: Somewhat hard  Food Insecurity: Food Insecurity Present (08/18/2024)   Epic    Worried About Programme Researcher, Broadcasting/film/video in the Last Year: Never true    Ran Out of Food in the Last Year: Sometimes true  Transportation Needs: No Transportation Needs (08/18/2024)   Epic    Lack of  Transportation (Medical): No    Lack of Transportation (Non-Medical): No  Physical Activity: Insufficiently Active (08/18/2024)   Exercise Vital Sign    Days of Exercise per Week: 3 days    Minutes of Exercise per Session: 30 min  Stress: No Stress Concern Present (08/18/2024)   [employer] of Occupational Health - Occupational Stress Questionnaire    Feeling of Stress: Only a little  Social Connections: Moderately Isolated (08/18/2024)   Social Connection and Isolation Panel    Frequency of Communication with Friends and Family: More than three times a week    Frequency of Social Gatherings with Friends and Family: More than three times a week    Attends Religious Services: More than 4 times per year    Active Member of [employer] or Organizations: No    Attends Engineer, Structural: Not on file    Marital Status: Never married  Intimate Partner Violence: Not on file  Depression (PHQ2-9): Low Risk (07/08/2023)   Depression (PHQ2-9)    PHQ-2 Score: 1  Alcohol Screen: Not on file  Housing: Low Risk (08/18/2024)   Epic    Unable to Pay for Housing in the Last Year: No    Number of Times Moved in the Last Year: 0    Homeless in the Last Year: No  Utilities: Not on file  Health Literacy: Not on file    ROS All ROS negative except what is listed in the HPI.   Objective:   Today's Vitals: BP (!) 106/56   Pulse (!) 108   Temp 98 F (36.7 C) (Oral)   Resp 16   Ht 5' 6 (1.676 m)   Wt 195 lb 9.6 oz (88.7 kg)   LMP 08/25/2024 (Exact Date)   SpO2 99%   BMI 31.57 kg/m   Orthostatics - lying, sitting, standing: 111/66 HR 84 112/70 HR 91 106/56 HR 108    Physical Exam Vitals reviewed.  Constitutional:      Appearance: Normal appearance.  Cardiovascular:     Rate and Rhythm: Normal rate and regular rhythm.     Heart sounds: Normal heart sounds.  Pulmonary:     Effort: Pulmonary effort is normal.     Breath sounds: Normal breath sounds.  Skin:    General: Skin  is warm and dry.     Comments: Slight axillary hyperpigmentation   Neurological:     Mental Status: She is alert and oriented to person, place, and time.  Psychiatric:        Mood and Affect: Mood normal.        Behavior: Behavior normal.        Thought Content: Thought content normal.  Judgment: Judgment normal.           Assessment & Plan:   Problem List Items Addressed This Visit       Active Problems   Prediabetes - Primary   Relevant Orders   Hemoglobin A1c   Comprehensive metabolic panel with GFR   Other Visit Diagnoses       Class 1 obesity without serious comorbidity with body mass index (BMI) of 31.0 to 31.9 in adult, unspecified obesity type         Palpitations       Relevant Orders   B12 and Folate Panel   CBC with Differential/Platelet   LONG TERM MONITOR (3-14 DAYS)     Dizziness       Relevant Orders   B12 and Folate Panel   CBC with Differential/Platelet   LONG TERM MONITOR (3-14 DAYS)     Fatigue, unspecified type       Relevant Orders   B12 and Folate Panel   CBC with Differential/Platelet   Comprehensive metabolic panel with GFR   TSH   IBC + Ferritin     Vitamin D  deficiency       Relevant Orders   Vitamin D  (25 hydroxy)       - Ordered blood work  - Discussed dietary changes to reduce processed and high-carb foods. - Advised glucometer for home blood glucose monitoring. - Encouraged hydration and protein-rich snacks. - Ordered heart monitor for dizziness and palpitations. - Advised on sleep hygiene and hydration. - Patient aware of signs/symptoms requiring further/urgent evaluation.         Follow-up: Return for - pending results or sooner if needed.   Waddell FURY Almarie, DNP, FNP-C  I,Emily Lagle,acting as a neurosurgeon for Waddell KATHEE Almarie, NP.,have documented all relevant documentation on the behalf of Waddell KATHEE Almarie, NP.   I, Waddell KATHEE Almarie, NP, have reviewed all documentation for this visit. The documentation on 08/26/2024  for the exam, diagnosis, procedures, and orders are all accurate and complete.

## 2024-08-30 ENCOUNTER — Ambulatory Visit: Admitting: Family Medicine

## 2024-08-31 ENCOUNTER — Other Ambulatory Visit

## 2024-08-31 DIAGNOSIS — R7303 Prediabetes: Secondary | ICD-10-CM | POA: Diagnosis not present

## 2024-08-31 DIAGNOSIS — R002 Palpitations: Secondary | ICD-10-CM

## 2024-08-31 DIAGNOSIS — R5383 Other fatigue: Secondary | ICD-10-CM | POA: Diagnosis not present

## 2024-08-31 DIAGNOSIS — E559 Vitamin D deficiency, unspecified: Secondary | ICD-10-CM | POA: Diagnosis not present

## 2024-08-31 DIAGNOSIS — R42 Dizziness and giddiness: Secondary | ICD-10-CM

## 2024-09-01 ENCOUNTER — Ambulatory Visit: Payer: Self-pay | Admitting: Family Medicine

## 2024-09-01 DIAGNOSIS — R7989 Other specified abnormal findings of blood chemistry: Secondary | ICD-10-CM

## 2024-09-01 DIAGNOSIS — R7303 Prediabetes: Secondary | ICD-10-CM

## 2024-09-01 DIAGNOSIS — E559 Vitamin D deficiency, unspecified: Secondary | ICD-10-CM

## 2024-09-01 LAB — B12 AND FOLATE PANEL
Folate: 10.3 ng/mL (ref >5.9)
Vitamin B-12: 230 pg/mL (ref 211–911)

## 2024-09-01 LAB — CBC WITH DIFFERENTIAL/PLATELET
Basophils Absolute: 0.1 K/uL (ref 0.0–0.1)
Basophils Relative: 0.7 % (ref 0.0–3.0)
Eosinophils Absolute: 0.1 K/uL (ref 0.0–0.7)
Eosinophils Relative: 0.9 % (ref 0.0–5.0)
HCT: 46.5 % — ABNORMAL HIGH (ref 36.0–46.0)
Hemoglobin: 15.3 g/dL — ABNORMAL HIGH (ref 12.0–15.0)
Lymphocytes Relative: 15.6 % (ref 12.0–46.0)
Lymphs Abs: 2.3 K/uL (ref 0.7–4.0)
MCHC: 32.8 g/dL (ref 30.0–36.0)
MCV: 82.8 fl (ref 78.0–100.0)
Monocytes Absolute: 0.9 K/uL (ref 0.1–1.0)
Monocytes Relative: 6.1 % (ref 3.0–12.0)
Neutro Abs: 11.3 K/uL — ABNORMAL HIGH (ref 1.4–7.7)
Neutrophils Relative %: 76.7 % (ref 43.0–77.0)
Platelets: 333 K/uL (ref 150.0–400.0)
RBC: 5.62 Mil/uL — ABNORMAL HIGH (ref 3.87–5.11)
RDW: 13.3 % (ref 11.5–15.5)
WBC: 14.8 K/uL — ABNORMAL HIGH (ref 4.0–10.5)

## 2024-09-01 LAB — COMPREHENSIVE METABOLIC PANEL WITH GFR
ALT: 16 U/L (ref 3–35)
AST: 14 U/L (ref 5–37)
Albumin: 4.7 g/dL (ref 3.5–5.2)
Alkaline Phosphatase: 68 U/L (ref 39–117)
BUN: 11 mg/dL (ref 6–23)
CO2: 26 meq/L (ref 19–32)
Calcium: 9.4 mg/dL (ref 8.4–10.5)
Chloride: 102 meq/L (ref 96–112)
Creatinine, Ser: 0.66 mg/dL (ref 0.40–1.20)
GFR: 124.92 mL/min (ref >60.00)
Glucose, Bld: 106 mg/dL — ABNORMAL HIGH (ref 70–99)
Potassium: 4.1 meq/L (ref 3.5–5.1)
Sodium: 137 meq/L (ref 135–145)
Total Bilirubin: 0.5 mg/dL (ref 0.2–1.2)
Total Protein: 7.4 g/dL (ref 6.0–8.3)

## 2024-09-01 LAB — VITAMIN D 25 HYDROXY (VIT D DEFICIENCY, FRACTURES): VITD: 10.6 ng/mL — ABNORMAL LOW (ref 30.00–100.00)

## 2024-09-01 LAB — IBC + FERRITIN
Ferritin: 18.8 ng/mL (ref 10.0–291.0)
Iron: 98 ug/dL (ref 42–145)
Saturation Ratios: 20.8 % (ref 20.0–50.0)
TIBC: 471.8 ug/dL — ABNORMAL HIGH (ref 250.0–450.0)
Transferrin: 337 mg/dL (ref 212.0–360.0)

## 2024-09-01 LAB — HEMOGLOBIN A1C: Hgb A1c MFr Bld: 6 % (ref 4.6–6.5)

## 2024-09-01 LAB — TSH: TSH: 0.84 u[IU]/mL (ref 0.35–5.50)

## 2024-09-01 MED ORDER — VITAMIN D (ERGOCALCIFEROL) 1.25 MG (50000 UNIT) PO CAPS: 50000.0000 [IU] | ORAL_CAPSULE | ORAL | 0 refills | Status: AC

## 2024-09-06 MED ORDER — METFORMIN HCL 500 MG PO TABS: 500.0000 mg | ORAL_TABLET | Freq: Two times a day (BID) | ORAL | 3 refills | Status: AC

## 2024-09-06 NOTE — Addendum Note (Signed)
 Addended by: ALMARIE BIRMINGHAM B on: 09/06/2024 08:30 AM   Modules accepted: Orders

## 2024-10-04 NOTE — Progress Notes (Incomplete)
 "   New Patient Office Visit   Subjective     Patient ID: Kristen Sexton, female   DOB: 17-Feb-2001  Age: 24 y.o. MRN: [id number]   CC:  No chief complaint on file.     HPI Kristen Sexton presents to transfer care.   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  - Exercise: regular gym attendance  - Denies symptoms of hypoglycemia, polyuria, polydipsia, numbness extremities, foot ulcers/trauma, wounds that are not healing, medication side effects  Lab Results  Component Value Date   HGBA1C 6.0 08/31/2024   Wt Readings from Last 3 Encounters:  08/26/24 195 lb 9.6 oz (88.7 kg)  12/31/23 187 lb 3.2 oz (84.9 kg)  10/24/23 184 lb 12.8 oz (83.8 kg)     Vitamin B12, Vitamin-D Deficiency: Lab Results  Component Value Date   VD25OH 10.60 (L) 08/31/2024   Lab Results  Component Value Date   VITAMINB12 230 08/31/2024     Show/hide medication list[1] Past Medical History:  Diagnosis Date   Allergy    Environmental allergies    Pre-diabetes     Past Surgical History:  Procedure Laterality Date   NO PAST SURGERIES     WISDOM TOOTH EXTRACTION Bilateral 03/16/2024   3 Wisdom teeth     Family History  Problem Relation Age of Onset   Hyperlipidemia Mother    Allergic rhinitis Neg Hx    Angioedema Neg Hx    Asthma Neg Hx    Eczema Neg Hx    Immunodeficiency Neg Hx    Urticaria Neg Hx    Irritable bowel syndrome Neg Hx    Celiac disease Neg Hx    GER disease Neg Hx     Social History   Socioeconomic History   Marital status: Single    Spouse name: Not on file   Number of children: Not on file   Years of education: Not on file   Highest education level: GED or equivalent  Occupational History   Not on file  Tobacco Use   Smoking status: Never   Smokeless tobacco: Never  Vaping Use   Vaping status: Never Used  Substance and Sexual  Activity   Alcohol use: Never   Drug use: Never   Sexual activity: Never    Birth control/protection: None  Other Topics Concern   Not on file  Social History Narrative   10th grade at [employer]   Social Drivers of Health   Tobacco Use: Low Risk (08/26/2024)   Patient History    Smoking Tobacco Use: Never    Smokeless Tobacco Use: Never    Passive Exposure: Not on file  Financial Resource Strain: Medium Risk (08/18/2024)   Overall Financial Resource Strain (CARDIA)    Difficulty of Paying Living Expenses: Somewhat hard  Food Insecurity: Food Insecurity Present (08/18/2024)   Epic    Worried About Programme Researcher, Broadcasting/film/video in the Last Year: Never true    [employer] of Food in the Last Year: Sometimes true  Transportation Needs: No Transportation Needs (08/18/2024)   Epic    Lack of Transportation (Medical): No    Lack of Transportation (Non-Medical): No  Physical Activity: Insufficiently Active (08/18/2024)   Exercise Vital Sign    Days of Exercise per Week: 3 days    Minutes of Exercise per Session: 30 min  Stress: No Stress Concern Present (08/18/2024)  [employer] of Occupational Health - Occupational Stress Questionnaire    Feeling of Stress: Only a little  Social Connections: Moderately Isolated (08/18/2024)   Social Connection and Isolation Panel    Frequency of Communication with Friends and Family: More than three times a week    Frequency of Social Gatherings with Friends and Family: More than three times a week    Attends Religious Services: More than 4 times per year    Active Member of [employer] or Organizations: No    Attends Banker Meetings: Not on file    Marital Status: Never married  Depression (PHQ2-9): Low Risk (07/08/2023)   Depression (PHQ2-9)    PHQ-2 Score: 1  Alcohol Screen: Not on file  Housing: Low Risk (08/18/2024)   Epic    Unable to Pay for Housing in the Last Year: No    Number of Times Moved in the Last Year: 0    Homeless in the Last Year:  No  Utilities: Not on file  Health Literacy: Not on file       ROS All review of systems negative except what is listed in the HPI    Objective     There were no vitals taken for this visit.  Physical Exam     Assessment & Plan:     Problem List Items Addressed This Visit   None Visit Diagnoses       Encounter for medical examination to establish care    -  Primary                No follow-ups on file.  Waddell KATHEE Mon, NP  I,Emily Lagle,acting as a scribe for Waddell KATHEE Mon, NP.,have documented all relevant documentation on the behalf of Waddell KATHEE Mon, NP.  I, Waddell KATHEE Mon, NP, have reviewed all documentation for this visit. The documentation on 10/05/2024 for the exam, diagnosis, procedures, and orders are all accurate and complete.     [1]  Outpatient Medications Prior to Visit  Medication Sig   ketoconazole  (NIZORAL ) 2 % shampoo Apply 1 Application topically 2 (two) times a week.   metFORMIN  (GLUCOPHAGE ) 500 MG tablet Take 1 tablet (500 mg total) by mouth 2 (two) times daily with a meal. Start with once daily for the first 2 weeks.   triamcinolone  cream (KENALOG ) 0.1 % Apply 1 Application topically 2 (two) times daily as needed.   Vitamin D , Ergocalciferol , (DRISDOL ) 1.25 MG (50000 UNIT) CAPS capsule Take 1 capsule (50,000 Units total) by mouth every 7 (seven) days.   No facility-administered medications prior to visit.   "

## 2024-10-05 ENCOUNTER — Encounter: Admitting: Family Medicine

## 2024-10-05 ENCOUNTER — Other Ambulatory Visit

## 2024-10-05 DIAGNOSIS — Z0000 Encounter for general adult medical examination without abnormal findings: Secondary | ICD-10-CM

## 2024-10-19 ENCOUNTER — Encounter: Admitting: Family Medicine

## 2024-11-04 ENCOUNTER — Ambulatory Visit: Admitting: Dermatology
